# Patient Record
Sex: Female | Born: 1975 | Race: Black or African American | Marital: Single | Smoking: Former smoker
Health system: Southern US, Community
[De-identification: ages and names within clinical notes are randomized; demographics above are authoritative.]

## PROBLEM LIST (undated history)

## (undated) DIAGNOSIS — F329 Major depressive disorder, single episode, unspecified: Secondary | ICD-10-CM

## (undated) DIAGNOSIS — A6920 Lyme disease, unspecified: Secondary | ICD-10-CM

## (undated) DIAGNOSIS — K279 Peptic ulcer, site unspecified, unspecified as acute or chronic, without hemorrhage or perforation: Secondary | ICD-10-CM

## (undated) DIAGNOSIS — F32A Depression, unspecified: Secondary | ICD-10-CM

## (undated) DIAGNOSIS — F419 Anxiety disorder, unspecified: Secondary | ICD-10-CM

## (undated) DIAGNOSIS — G4730 Sleep apnea, unspecified: Secondary | ICD-10-CM

## (undated) HISTORY — DX: Peptic ulcer, site unspecified, unspecified as acute or chronic, without hemorrhage or perforation: K27.9

## (undated) HISTORY — PX: OTHER SURGICAL HISTORY: SHX169

---
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
## 2016-02-01 ENCOUNTER — Ambulatory Visit (HOSPITAL_COMMUNITY)
Admission: EM | Admit: 2016-02-01 | Discharge: 2016-02-01 | Disposition: A | Discharge status: Home / Self Care | Payer: Self-pay | Attending: Emergency Medicine | Admitting: Emergency Medicine

## 2016-02-01 ENCOUNTER — Encounter (HOSPITAL_COMMUNITY): Payer: Self-pay | Admitting: *Deleted

## 2016-02-01 ENCOUNTER — Ambulatory Visit (INDEPENDENT_AMBULATORY_CARE_PROVIDER_SITE_OTHER): Payer: Self-pay

## 2016-02-01 DIAGNOSIS — S161XXA Strain of muscle, fascia and tendon at neck level, initial encounter: Secondary | ICD-10-CM

## 2016-02-01 HISTORY — DX: Lyme disease, unspecified: A69.20

## 2016-02-01 MED ORDER — METHOCARBAMOL 500 MG PO TABS: 500.0000 mg | ORAL_TABLET | Freq: Two times a day (BID) | ORAL | 0 refills | Status: DC

## 2016-02-01 MED ORDER — IBUPROFEN 800 MG PO TABS: 800.0000 mg | ORAL_TABLET | Freq: Three times a day (TID) | ORAL | 0 refills | Status: DC
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
## 2016-04-14 ENCOUNTER — Other Ambulatory Visit: Payer: Self-pay | Admitting: Gastroenterology

## 2016-04-14 DIAGNOSIS — R1032 Left lower quadrant pain: Secondary | ICD-10-CM

## 2016-04-19 ENCOUNTER — Ambulatory Visit
Admission: RE | Admit: 2016-04-19 | Discharge: 2016-04-19 | Disposition: A | Discharge status: Home / Self Care | Payer: Self-pay | Source: Ambulatory Visit | Attending: Gastroenterology | Admitting: Gastroenterology

## 2016-04-19 DIAGNOSIS — R1032 Left lower quadrant pain: Secondary | ICD-10-CM

## 2016-04-19 MED ORDER — IOPAMIDOL (ISOVUE-300) INJECTION 61%: 100.0000 mL | Freq: Once | INTRAVENOUS | Status: DC | PRN

## 2016-05-05 ENCOUNTER — Other Ambulatory Visit: Payer: Self-pay | Admitting: Obstetrics and Gynecology

## 2016-05-05 ENCOUNTER — Other Ambulatory Visit (HOSPITAL_COMMUNITY)
Admission: RE | Admit: 2016-05-05 | Discharge: 2016-05-05 | Disposition: A | Discharge status: Home / Self Care | Payer: BLUE CROSS/BLUE SHIELD | Source: Ambulatory Visit | Attending: Obstetrics and Gynecology | Admitting: Obstetrics and Gynecology

## 2016-05-05 DIAGNOSIS — R87810 Cervical high risk human papillomavirus (HPV) DNA test positive: Secondary | ICD-10-CM | POA: Insufficient documentation

## 2016-05-05 DIAGNOSIS — Z1151 Encounter for screening for human papillomavirus (HPV): Secondary | ICD-10-CM | POA: Diagnosis not present

## 2016-05-05 DIAGNOSIS — Z01419 Encounter for gynecological examination (general) (routine) without abnormal findings: Secondary | ICD-10-CM | POA: Insufficient documentation

## 2016-05-06 LAB — CYTOLOGY - PAP
Diagnosis: NEGATIVE
HPV 16/18/45 genotyping: NEGATIVE
HPV: DETECTED — AB

## 2017-05-08 ENCOUNTER — Other Ambulatory Visit: Payer: Self-pay | Admitting: Obstetrics and Gynecology

## 2017-05-08 ENCOUNTER — Other Ambulatory Visit (HOSPITAL_COMMUNITY)
Admission: RE | Admit: 2017-05-08 | Discharge: 2017-05-08 | Disposition: A | Discharge status: Home / Self Care | Payer: BLUE CROSS/BLUE SHIELD | Source: Ambulatory Visit | Attending: Obstetrics and Gynecology | Admitting: Obstetrics and Gynecology

## 2017-05-08 DIAGNOSIS — Z01411 Encounter for gynecological examination (general) (routine) with abnormal findings: Secondary | ICD-10-CM | POA: Diagnosis not present

## 2017-05-10 LAB — CYTOLOGY - PAP
Chlamydia: NEGATIVE
Diagnosis: NEGATIVE
HPV 16/18/45 genotyping: NEGATIVE
HPV: DETECTED — AB
Neisseria Gonorrhea: NEGATIVE

## 2017-05-31 ENCOUNTER — Other Ambulatory Visit: Payer: Self-pay | Admitting: Obstetrics and Gynecology

## 2018-07-02 ENCOUNTER — Other Ambulatory Visit: Payer: Self-pay | Admitting: Obstetrics and Gynecology

## 2018-07-02 ENCOUNTER — Other Ambulatory Visit (HOSPITAL_COMMUNITY)
Admission: RE | Admit: 2018-07-02 | Discharge: 2018-07-02 | Disposition: A | Discharge status: Home / Self Care | Payer: BC Managed Care – PPO | Source: Ambulatory Visit | Attending: Obstetrics and Gynecology | Admitting: Obstetrics and Gynecology

## 2018-07-02 DIAGNOSIS — Z124 Encounter for screening for malignant neoplasm of cervix: Secondary | ICD-10-CM | POA: Insufficient documentation

## 2018-07-03 LAB — CYTOLOGY - PAP
Chlamydia: NEGATIVE
Diagnosis: NEGATIVE
HPV: NOT DETECTED
Neisseria Gonorrhea: NEGATIVE

## 2018-09-14 ENCOUNTER — Other Ambulatory Visit: Payer: Self-pay

## 2018-09-14 ENCOUNTER — Emergency Department (HOSPITAL_COMMUNITY): Payer: BC Managed Care – PPO

## 2018-09-14 ENCOUNTER — Encounter (HOSPITAL_COMMUNITY): Payer: Self-pay | Admitting: Emergency Medicine

## 2018-09-14 ENCOUNTER — Emergency Department (HOSPITAL_COMMUNITY)
Admission: EM | Admit: 2018-09-14 | Discharge: 2018-09-15 | Disposition: A | Discharge status: Home / Self Care | Payer: BC Managed Care – PPO | Attending: Emergency Medicine | Admitting: Emergency Medicine

## 2018-09-14 DIAGNOSIS — F419 Anxiety disorder, unspecified: Secondary | ICD-10-CM

## 2018-09-14 DIAGNOSIS — R0602 Shortness of breath: Secondary | ICD-10-CM | POA: Insufficient documentation

## 2018-09-14 DIAGNOSIS — R0789 Other chest pain: Secondary | ICD-10-CM | POA: Diagnosis present

## 2018-09-14 DIAGNOSIS — Z20828 Contact with and (suspected) exposure to other viral communicable diseases: Secondary | ICD-10-CM | POA: Insufficient documentation

## 2018-09-14 DIAGNOSIS — Z79899 Other long term (current) drug therapy: Secondary | ICD-10-CM | POA: Diagnosis not present

## 2018-09-14 DIAGNOSIS — Z87891 Personal history of nicotine dependence: Secondary | ICD-10-CM | POA: Insufficient documentation

## 2018-09-14 HISTORY — DX: Depression, unspecified: F32.A

## 2018-09-14 HISTORY — DX: Anxiety disorder, unspecified: F41.9

## 2018-09-14 HISTORY — DX: Sleep apnea, unspecified: G47.30

## 2018-09-14 LAB — BASIC METABOLIC PANEL
Anion gap: 12 (ref 5–15)
BUN: 8 mg/dL (ref 6–20)
CO2: 21 mmol/L — ABNORMAL LOW (ref 22–32)
Calcium: 9 mg/dL (ref 8.9–10.3)
Chloride: 102 mmol/L (ref 98–111)
Creatinine, Ser: 1.28 mg/dL — ABNORMAL HIGH (ref 0.44–1.00)
GFR calc Af Amer: >60 mL/min (ref >60)
GFR calc non Af Amer: 52 mL/min — ABNORMAL LOW (ref >60)
Glucose, Bld: 159 mg/dL — ABNORMAL HIGH (ref 70–99)
Potassium: 3 mmol/L — ABNORMAL LOW (ref 3.5–5.1)
Sodium: 135 mmol/L (ref 135–145)

## 2018-09-14 LAB — CBC
HCT: 39.3 % (ref 36.0–46.0)
Hemoglobin: 12.7 g/dL (ref 12.0–15.0)
MCH: 28.7 pg (ref 26.0–34.0)
MCHC: 32.3 g/dL (ref 30.0–36.0)
MCV: 88.9 fL (ref 80.0–100.0)
Platelets: 349 10*3/uL (ref 150–400)
RBC: 4.42 MIL/uL (ref 3.87–5.11)
RDW: 11.9 % (ref 11.5–15.5)
WBC: 7.3 10*3/uL (ref 4.0–10.5)
nRBC: 0 % (ref 0.0–0.2)

## 2018-09-14 LAB — D-DIMER, QUANTITATIVE: D-Dimer, Quant: <0.27 ug/mL-FEU (ref 0.00–0.50)

## 2018-09-14 LAB — TROPONIN I (HIGH SENSITIVITY)
Troponin I (High Sensitivity): <2 ng/L (ref <18)
Troponin I (High Sensitivity): <2 ng/L (ref <18)

## 2018-09-14 LAB — RAPID URINE DRUG SCREEN, HOSP PERFORMED
Amphetamines: NOT DETECTED
Barbiturates: NOT DETECTED
Benzodiazepines: NOT DETECTED
Cocaine: NOT DETECTED
Opiates: NOT DETECTED
Tetrahydrocannabinol: POSITIVE — AB

## 2018-09-14 LAB — POC URINE PREG, ED: Preg Test, Ur: NEGATIVE

## 2018-09-14 LAB — ETHANOL: Alcohol, Ethyl (B): <10 mg/dL (ref <10)

## 2018-09-14 MED ORDER — LORAZEPAM 2 MG/ML IJ SOLN: 1.0000 mg | Freq: Once | INTRAMUSCULAR | Status: DC

## 2018-09-14 MED ORDER — POTASSIUM CHLORIDE CRYS ER 20 MEQ PO TBCR
40.0000 meq | EXTENDED_RELEASE_TABLET | Freq: Once | ORAL | Status: AC
Start: 2018-09-14 — End: 2018-09-14
  Administered 2018-09-14: 40 meq via ORAL
  Filled 2018-09-14: qty 2

## 2018-09-14 MED ORDER — LORAZEPAM 1 MG PO TABS
1.0000 mg | ORAL_TABLET | Freq: Once | ORAL | Status: AC
  Administered 2018-09-14: 1 mg via ORAL
  Filled 2018-09-14: qty 1

## 2018-09-14 NOTE — ED Notes (Signed)
EKG completed while patient was talking on the phone, hyperventilating and not sitting still despite all instruction from nurse to do otherwise.

## 2018-09-14 NOTE — ED Notes (Signed)
Pt on phone with family. Family trying to relieve pt anxiety.

## 2018-09-14 NOTE — ED Provider Notes (Signed)
[facility] EMERGENCY DEPARTMENT Provider Note   CSN: [id number] Arrival date & time: 09/14/18  1958     History   Chief Complaint Chief Complaint  Patient presents with  . Chest Pain    HPI Amanda Hernandez is a 43 y.o. female.     The history is provided by the patient. No language interpreter was used.  Shortness of Breath Severity:  Severe Onset quality:  Gradual Duration:  1 day Timing:  Constant Progression:  Worsening Chronicity:  New Context: emotional upset   Relieved by:  Nothing Worsened by:  Nothing Ineffective treatments:  None tried Associated symptoms: no abdominal pain   Risk factors: no recent alcohol use   Pt reports she has been very anxious about getting covid.  Pt reports she was taking klonopin but her MD switched her to ativan.  Pt reports she has been taking.    Past Medical History:  Diagnosis Date  . Anxiety   . Depression   . Lyme disease   . Sleep apnea     There are no active problems to display for this patient.   History reviewed. No pertinent surgical history.   OB History   No obstetric history on file.      Home Medications    Prior to Admission medications   Medication Sig Start Date End Date Taking? Authorizing Provider  ibuprofen (ADVIL,MOTRIN) 800 MG tablet Take 1 tablet (800 mg total) by mouth 3 (three) times daily. Patient not taking: Reported on 09/14/2018 02/01/16   Elson AreasSofia, Leslie K, PA-C  methocarbamol (ROBAXIN) 500 MG tablet Take 1 tablet (500 mg total) by mouth 2 (two) times daily. Patient not taking: Reported on 09/14/2018 02/01/16   Osie CheeksSofia, Leslie K, PA-C    Family History No family history on file.  Social History Social History   Tobacco Use  . Smoking status: Former Games developermoker  . Smokeless tobacco: Never Used  Substance Use Topics  . Alcohol use: Yes    Comment: occ  . Drug use: Never     Allergies   Patient has no known allergies.   Review of Systems Review of Systems  Respiratory: Positive  for shortness of breath.   Gastrointestinal: Negative for abdominal pain.  All other systems reviewed and are negative.    Physical Exam Updated Vital Signs BP (!) 138/100   Pulse 96   Temp 98.1 F (36.7 C) (Oral)   Resp (!) 21   LMP 09/07/2018   SpO2 100%   Physical Exam Vitals signs and nursing note reviewed.  Constitutional:      Appearance: She is well-developed.  HENT:     Head: Normocephalic.  Neck:     Musculoskeletal: Normal range of motion.  Cardiovascular:     Rate and Rhythm: Normal rate.     Heart sounds: Normal heart sounds.  Pulmonary:     Effort: Pulmonary effort is normal.     Breath sounds: Normal breath sounds.  Abdominal:     General: Bowel sounds are normal. There is no distension.     Palpations: Abdomen is soft.  Musculoskeletal: Normal range of motion.  Skin:    General: Skin is warm.  Neurological:     General: No focal deficit present.     Mental Status: She is alert and oriented to person, place, and time.  Psychiatric:        Mood and Affect: Mood is anxious.     Comments: Pt hyperventilating.  Pt complains of tingling in extremities  ED Treatments / Results  Labs (all labs ordered are listed, but only abnormal results are displayed) Labs Reviewed  BASIC METABOLIC PANEL - Abnormal; Notable for the following components:      Result Value   Potassium 3.0 (*)    CO2 21 (*)    Glucose, Bld 159 (*)    Creatinine, Ser 1.28 (*)    GFR calc non Af Amer 52 (*)    All other components within normal limits  RAPID URINE DRUG SCREEN, HOSP PERFORMED - Abnormal; Notable for the following components:   Tetrahydrocannabinol POSITIVE (*)    All other components within normal limits  NOVEL CORONAVIRUS, NAA (HOSPITAL ORDER, SEND-OUT TO REF LAB)  CBC  ETHANOL  D-DIMER, QUANTITATIVE (NOT AT [facility])  POC URINE PREG, ED  TROPONIN I (HIGH SENSITIVITY)  TROPONIN I (HIGH SENSITIVITY)    EKG EKG Interpretation  Date/Time:  Friday September 14 2018 20:53:41 EDT Ventricular Rate:  85 PR Interval:    QRS Duration: 81 QT Interval:  370 QTC Calculation: 440 R Axis:   73 Text Interpretation:  Sinus rhythm Borderline prolonged PR interval Baseline wander No old tracing to compare Confirmed by Francine Graven [phone]) on 09/14/2018 9:00:25 PM   Radiology Dg Chest 2 View  Result Date: 09/14/2018 CLINICAL DATA:  Left-sided chest pain EXAM: CHEST - 2 VIEW COMPARISON:  None. FINDINGS: The heart size and mediastinal contours are within normal limits. Both lungs are clear. The visualized skeletal structures are unremarkable. IMPRESSION: No acute abnormality of the lungs. Electronically Signed   By: Eddie Candle M.D.   On: 09/14/2018 20:23    Procedures Procedures (including critical care time)  Medications Ordered in ED Medications  potassium chloride SA (K-DUR) CR tablet 40 mEq (has no administration in time range)  LORazepam (ATIVAN) tablet 1 mg (has no administration in time range)     Initial Impression / Assessment and Plan / ED Course  I have reviewed the triage vital signs and the nursing notes.  Pertinent labs & imaging results that were available during my care of the patient were reviewed by me and considered in my medical decision making (see chart for details).  Clinical Course as of Sep 13 2332  Fri Sep 14, 2018  2322 Troponin I (High Sensitivity): <2.0 [LS]    Clinical Course User Index [LS] Fransico Meadow, [place]      MDM  Ekg no acute abnormality, chest xray is normal, ddimer and troponin is normal.  Pt has potassium of 3.0.  Pt given kdur.  69meq.  Ativan 1 mg Iv.   Covid test is sent. Pt's symptoms seem to be second to anxiety.  Pt denies suicidal or omicidal thoughts.    Final Clinical Impressions(s) / ED Diagnoses   Final diagnoses:  Anxiety    ED Discharge Orders    None    An After Visit Summary was printed and given to the patient.    Fransico Meadow, PA-C 09/14/18 [place],  [place], DO 09/20/18 [phone]

## 2018-09-14 NOTE — Discharge Instructions (Addendum)
C all your Physician on Monday to discuss anxiety medications.

## 2018-09-14 NOTE — ED Triage Notes (Signed)
Pt here with L sided chest pain since this am. States it has gotten worse and feels like it is radiating down L arm. Pt with hx of anxiety and states she is having withdrawals from Klonopin. States she stopped it a month ago because she feels like it was making her anxiety worse.

## 2018-09-16 LAB — NOVEL CORONAVIRUS, NAA (HOSP ORDER, SEND-OUT TO REF LAB; TAT 18-24 HRS): SARS-CoV-2, NAA: NOT DETECTED

## 2018-09-17 ENCOUNTER — Encounter (HOSPITAL_COMMUNITY): Payer: Self-pay | Admitting: Emergency Medicine

## 2018-09-17 ENCOUNTER — Ambulatory Visit (HOSPITAL_COMMUNITY)
Admission: EM | Admit: 2018-09-17 | Discharge: 2018-09-17 | Disposition: A | Discharge status: Home / Self Care | Payer: BC Managed Care – PPO | Attending: Emergency Medicine | Admitting: Emergency Medicine

## 2018-09-17 ENCOUNTER — Other Ambulatory Visit: Payer: Self-pay

## 2018-09-17 DIAGNOSIS — F419 Anxiety disorder, unspecified: Secondary | ICD-10-CM | POA: Diagnosis present

## 2018-09-17 DIAGNOSIS — Z760 Encounter for issue of repeat prescription: Secondary | ICD-10-CM | POA: Diagnosis present

## 2018-09-17 DIAGNOSIS — R112 Nausea with vomiting, unspecified: Secondary | ICD-10-CM | POA: Diagnosis present

## 2018-09-17 MED ORDER — CLONAZEPAM 0.5 MG PO TABS: 0.5000 mg | ORAL_TABLET | Freq: Two times a day (BID) | ORAL | 0 refills | Status: DC | PRN

## 2018-09-17 MED ORDER — QUETIAPINE FUMARATE 50 MG PO TABS: 50.0000 mg | ORAL_TABLET | Freq: Two times a day (BID) | ORAL | 0 refills | Status: DC

## 2018-09-17 MED ORDER — ONDANSETRON 4 MG PO TBDP: 4.0000 mg | ORAL_TABLET | Freq: Three times a day (TID) | ORAL | 0 refills | Status: DC | PRN

## 2018-09-17 NOTE — Discharge Instructions (Addendum)
Discontinue the Vyvanse, lorazepam.  Decrease his Seroquel to 50 mg during the day and 100 mg at night.  Restart the Klonopin.  Zofran as needed for nausea and vomiting.  I would advise a bland diet for the next several days.  Follow-up with your psychiatrist within a week to notify them of the medication change and to come up with something that works for you.  Follow-up with primary care physician of your choice, see list below.  You may need another round of Holter monitoring, follow-up with cardiology for this.  Below is a list of primary care practices who are taking new patients for you to follow-up with.  [facility] at [facility] [address] [place], [place] [address] [phone]  [place], [place] [address] [phone]  [facility] [phone] [place] [address]   family Practice Center: [place]  [phone]  [place] [facility]: [place]   [phone]  [facility] Family Medicine: [address] [place]  [phone]  [facility] : [address] [place] [phone]  [facility] Primary Care: [address] [place] [id number] [phone]  [facility]: [place] [phone]  Dr. Blanchie Serve [place] [facility]  [phone]  Dr. Benito Mccreedy, [facility]. [place], [place] [address]  [phone]  Go to www.goodrx.com to look up your medications. This will give you a list of where you can find your prescriptions at the most affordable prices. Or ask  the pharmacist what the cash price is, or if they have any other discount programs available to help make your medication more affordable. This can be less expensive than what you would pay with insurance.

## 2018-09-17 NOTE — ED Triage Notes (Addendum)
Patient seen at [facility] 8/21  Off klonopin for a month, has been taking lorazepam.  But symptoms getting worse  Says psychiatrist -unable to make an appt and does not believe they know what they are talking about

## 2018-09-17 NOTE — ED Provider Notes (Signed)
HPI  SUBJECTIVE:  Amanda Hernandez is a 43 y.o. female who presents with episodes of tachycardia, "shakiness ", nausea and malaise since having her medications changed 2 months ago.  States that her heart rate is in the 160s during these episodes per her Apple Watch. states like she feels as if she is having a heart attack during these episodes.  She states that she was on 50 mg of Seroquel, but it was increased to 200.  She was on Klonopin as needed daily and was switched to lorazepam.  She was also started on Vyvanse.  She states that the lorazepam makes her nauseous and that the Vyvanse triggers off episodes of tachycardia.  She states that the low-dose Seroquel/Klonopin worked well for her for 2 years.  She was seen in the ER 3 days ago for chest pain, tachycardia, nausea.  Chest pain ER work-up including EKG, dimer, 2 troponins, were unremarkable.  She also reports nausea and vomiting starting 2 days ago.  No vomiting today.  She is tolerating p.o.  No change in urine output, abdominal pain, fevers, questionable leftovers, raw or undercooked foods.  She is having normal bowel movements now.  States that she tested negative for COVID 3 days ago in the ED.  She tried a leftover prescription of Valium for her symptoms.  Pushing fluids seem to make her feel better.  No aggravating factors.  Past medical history of chronic Lyme disease, anxiety/depression.  She has had a Holter monitor twice with nothing found.  No history of SVT, atrial fibrillation, hypothyroidism.  Family history significant for mother with hyperthyroidism, negative for Wolff-Parkinson-White disease, arrhythmia.  LMP: Last week.  PMD: None.  Psychiatrist: Dr. Ladona Ridgelaylor.    Past Medical History:  Diagnosis Date  . Anxiety   . Depression   . Lyme disease   . Sleep apnea     History reviewed. No pertinent surgical history.  Family History  Problem Relation Age of Onset  . Post-traumatic stress disorder Mother   . Diabetes Father      Social History   Tobacco Use  . Smoking status: Former Games developermoker  . Smokeless tobacco: Never Used  Substance Use Topics  . Alcohol use: Yes    Comment: occ  . Drug use: Never    No current facility-administered medications for this encounter.   Current Outpatient Medications:  .  Fe Fum-FePoly-Vit C-Vit B3 (INTEGRA PO), Take by mouth., Disp: , Rfl:  .  Lisdexamfetamine Dimesylate (VYVANSE PO), Take by mouth., Disp: , Rfl:  .  clonazePAM (KLONOPIN) 0.5 MG tablet, Take 1 tablet (0.5 mg total) by mouth 2 (two) times daily as needed for anxiety., Disp: 20 tablet, Rfl: 0 .  ondansetron (ZOFRAN ODT) 4 MG disintegrating tablet, Take 1 tablet (4 mg total) by mouth every 8 (eight) hours as needed for nausea or vomiting., Disp: 20 tablet, Rfl: 0 .  QUEtiapine (SEROQUEL) 50 MG tablet, Take 1 tablet (50 mg total) by mouth 2 (two) times daily. May take an additional 50 mg at bedtime, Disp: 40 tablet, Rfl: 0  Allergies  Allergen Reactions  . Tylenol With Codeine #3 [Acetaminophen-Codeine]      ROS  As noted in HPI.   Physical Exam  BP (!) 147/97 (BP Location: Right Arm)   Pulse 85   Temp 98.1 F (36.7 C) (Oral)   Resp 18   LMP 09/07/2018   SpO2 98%   Constitutional: Well developed, well nourished, no acute distress Eyes:  EOMI, conjunctiva normal bilaterally  HENT: Normocephalic, atraumatic,mucus membranes moist Neck: No tender ness of the thyroid, no thyromegaly Respiratory: Normal inspiratory effort Cardiovascular: Normal rate regular rhythm no murmurs rubs or gallops GI: nondistended soft, nontender, active bowel sounds, no guarding, rebound. skin: No rash, skin intact Musculoskeletal: no deformities Neurologic: Alert & oriented x 3, no focal neuro deficits Psychiatric: Speech and behavior appropriate   ED Course   Medications - No data to display  No orders of the defined types were placed in this encounter.   No results found for this or any previous visit (from  the past 24 hour(s)). No results found.  ED Clinical Impression  1. Anxiety   2. Medication refill   3. Non-intractable vomiting with nausea, unspecified vomiting type      ED Assessment/Plan  [place] Narcotic database reviewed for this patient, and feel that the risk/benefit ratio today is favorable for proceeding with a prescription for controlled substance.  Patient has been getting Klonopin, and then the vyvanse and lorazepam from the same practice for the past 2 years.  Suspect that patient is having some uncontrolled anxiety or may be due to a medication side effect since symptoms started after the medication changes.  Also in the differential is SVT, hyperthyroidism.  She has a benign abdomen and appears nontoxic today.  Unsure as to the cause of nausea and vomiting but does not appear to be an emergency today.  We will treat this with Zofran.  Will discontinue her Vyvanse as this is probably contributing to her tachycardia, restart her previous medication regimen by decreasing her Seroquel to 50 mg during the day, 100 mg at night, and restart her on Klonopin 0.5 twice daily as needed #20 since this combination of medication worked well for her for 2 years without any side effects.  Advised her to get in touch with her psychiatrist to notify them of the medication change and to work with them regarding a medication regimen that will work for her.  We will also provide a primary care list for routine care and a referral to [facility] cardiology should her symptoms persist.  In the differential is hyperthyroidism, but feel this is less likely given that the symptoms started after a medication change.  Discussed MDM, treatment plan, and plan for follow-up with patient. Discussed sn/sx that should prompt return to the ED. patient agrees with plan.   Meds ordered this encounter  Medications  . QUEtiapine (SEROQUEL) 50 MG tablet    Sig: Take 1 tablet (50 mg total) by mouth 2 (two) times daily. May take  an additional 50 mg at bedtime    Dispense:  40 tablet    Refill:  0  . clonazePAM (KLONOPIN) 0.5 MG tablet    Sig: Take 1 tablet (0.5 mg total) by mouth 2 (two) times daily as needed for anxiety.    Dispense:  20 tablet    Refill:  0  . ondansetron (ZOFRAN ODT) 4 MG disintegrating tablet    Sig: Take 1 tablet (4 mg total) by mouth every 8 (eight) hours as needed for nausea or vomiting.    Dispense:  20 tablet    Refill:  0    *This clinic note was created using [employer]. Therefore, there may be occasional mistakes despite careful proofreading.   ?   Melynda Ripple, MD 09/18/18 1218

## 2018-09-21 NOTE — Progress Notes (Signed)
Amanda Abideeferring-Ashley Mortenson MD Reason for referral-Palpitations  HPI: 43 yo female for evaluation of palpitations at request of Domenick Gongshley Mortenson MD. Labs recently 09/14/18 showed normal troponin, positive tetrahydrocannabinol, K 3, Cr 1.28, Hgb 12.7, negative DDimer; chest xray negative.  Patient seen with palpitations in the emergency room August 2020 and cardiology asked to evaluate.  Patient has had intermittent palpitations for approximately 2 years.  This can be described as a skip but also more sustained palpitations.  She has had some dizziness associated with the symptoms.  She also describes dyspnea at times both with exertion and at rest.  No orthopnea, PND or pedal edema.  She also has occasional chest pain lasting 30 minutes to 2 hours.  Not pleuritic, positional or exertional.  Current Outpatient Medications  Medication Sig Dispense Refill  . B COMPLEX VITAMINS PO Take 1 tablet by mouth daily.    . diazepam (VALIUM) 5 MG tablet Take 2.5 mg by mouth as needed for anxiety.    . Fe Fum-FePoly-Vit C-Vit B3 (INTEGRA PO) Take by mouth.    . fexofenadine (ALLEGRA) 180 MG tablet Take 180 mg by mouth daily.    . Lisdexamfetamine Dimesylate (VYVANSE PO) Take by mouth.    . ondansetron (ZOFRAN ODT) 4 MG disintegrating tablet Take 1 tablet (4 mg total) by mouth every 8 (eight) hours as needed for nausea or vomiting. 20 tablet 0  . Potassium 99 MG TABS Take 1 tablet by mouth daily.    . QUEtiapine (SEROQUEL) 50 MG tablet Take 1 tablet (50 mg total) by mouth 2 (two) times daily. May take an additional 50 mg at bedtime 40 tablet 0   No current facility-administered medications for this visit.     Allergies  Allergen Reactions  . Tylenol With Codeine #3 [Acetaminophen-Codeine]      Past Medical History:  Diagnosis Date  . Anxiety   . Depression   . Lyme disease   . PUD (peptic ulcer disease)   . Sleep apnea     Past Surgical History:  Procedure Laterality Date  . No prior surgery       Social History   Socioeconomic History  . Marital status: Single    Spouse name: Not on file  . Number of children: Not on file  . Years of education: Not on file  . Highest education level: Not on file  Occupational History  . Not on file  Social Needs  . Financial resource strain: Not on file  . Food insecurity    Worry: Not on file    Inability: Not on file  . Transportation needs    Medical: Not on file    Non-medical: Not on file  Tobacco Use  . Smoking status: Former Games developermoker  . Smokeless tobacco: Never Used  Substance and Sexual Activity  . Alcohol use: Yes    Comment: Rarely  . Drug use: Never  . Sexual activity: Never  Lifestyle  . Physical activity    Days per week: Not on file    Minutes per session: Not on file  . Stress: Not on file  Relationships  . Social Musicianconnections    Talks on phone: Not on file    Gets together: Not on file    Attends religious service: Not on file    Active member of club or organization: Not on file    Attends meetings of clubs or organizations: Not on file    Relationship status: Not on file  . Intimate  partner violence    Fear of current or ex partner: Not on file    Emotionally abused: Not on file    Physically abused: Not on file    Forced sexual activity: Not on file  Other Topics Concern  . Not on file  Social History Narrative  . Not on file    Family History  Problem Relation Age of Onset  . Post-traumatic stress disorder Mother   . Diabetes Father   . Hypertension Father     ROS: Describes decreased memory but no fevers or chills, productive cough, hemoptysis, dysphasia, odynophagia, melena, hematochezia, dysuria, hematuria, rash, seizure activity, orthopnea, PND, pedal edema, claudication. Remaining systems are negative.  Physical Exam:   Blood pressure 116/84, pulse 88, temperature 98.1 F (36.7 C), height 6' 1.5" (1.867 m), weight 201 lb (91.2 kg), last menstrual period 09/07/2018.  General:  Well  developed/well nourished in NAD Skin warm/dry; tattoos Patient not depressed No peripheral clubbing Back-normal HEENT-normal/normal eyelids Neck supple/normal carotid upstroke bilaterally; no bruits; no JVD; no thyromegaly chest - CTA/ normal expansion CV - RRR/normal S1 and S2; no murmurs, rubs or gallops;  PMI nondisplaced Abdomen -NT/ND, no HSM, no mass, + bowel sounds, no bruit 2+ femoral pulses, no bruits Ext-no edema, chords, 2+ DP Neuro-grossly nonfocal  ECG - 09/14/18; Sinus with no ST changes; personally reviewed  A/P  1 palpitations-etiology unclear.  Patient has an apple watch and she will forward strips to [place] to correlate with her symptoms.  Schedule echocardiogram to assess LV function.  Check TSH.  2 anxiety-Per primary care.  3 Chest pain-atypical.  Recent enzymes negative as well as d-dimer.  Electrocardiogram showed no ST changes.  Echocardiogram to assess wall motion and LV function.  4 dyspnea-schedule echocardiogram to assess LV function.  Recent d-dimer negative.  Kirk Ruths, MD

## 2018-09-25 ENCOUNTER — Encounter: Payer: Self-pay | Admitting: Cardiology

## 2018-09-25 ENCOUNTER — Ambulatory Visit: Payer: BC Managed Care – PPO | Admitting: Cardiology

## 2018-09-25 ENCOUNTER — Other Ambulatory Visit: Payer: Self-pay

## 2018-09-25 VITALS — BP 116/84 | HR 88 | Temp 98.1°F | Ht 73.5 in | Wt 201.0 lb

## 2018-09-25 DIAGNOSIS — R072 Precordial pain: Secondary | ICD-10-CM

## 2018-09-25 DIAGNOSIS — R002 Palpitations: Secondary | ICD-10-CM | POA: Diagnosis not present

## 2018-09-25 DIAGNOSIS — R0602 Shortness of breath: Secondary | ICD-10-CM

## 2018-09-25 LAB — TSH: TSH: 1.79 u[IU]/mL (ref 0.450–4.500)

## 2018-09-25 NOTE — Patient Instructions (Signed)
Medication Instructions:  NO CHANGE If you need a refill on your cardiac medications before your next appointment, please call your pharmacy.   Lab work: Your physician recommends that you HAVE LAB WORK TODAY If you have labs (blood work) drawn today and your tests are completely normal, you will receive your results only by: Marland Kitchen MyChart Message (if you have MyChart) OR . A paper copy in the mail If you have any lab test that is abnormal or we need to change your treatment, we will call you to review the results.  Testing/Procedures: Your physician has requested that you have an echocardiogram. Echocardiography is a painless test that uses sound waves to create images of your heart. It provides your doctor with information about the size and shape of your heart and how well your heart's chambers and valves are working. This procedure takes approximately one hour. There are no restrictions for this procedure.[place]    Follow-Up: At [facility], you and your health needs are our priority.  As part of our continuing mission to provide you with exceptional heart care, we have created designated Provider Care Teams.  These Care Teams include your primary Cardiologist (physician) and Advanced Practice Providers (APPs -  Physician Assistants and Nurse Practitioners) who all work together to provide you with the care you need, when you need it. Your physician recommends that you schedule a follow-up appointment in: 3-4 MONTHS WITH DR Stanford Breed

## 2018-10-03 ENCOUNTER — Other Ambulatory Visit: Payer: Self-pay

## 2018-10-03 ENCOUNTER — Ambulatory Visit (HOSPITAL_COMMUNITY): Payer: BC Managed Care – PPO | Attending: Cardiology

## 2018-10-03 ENCOUNTER — Telehealth: Payer: Self-pay

## 2018-10-03 DIAGNOSIS — R002 Palpitations: Secondary | ICD-10-CM | POA: Diagnosis present

## 2018-10-03 NOTE — Telephone Encounter (Signed)
Pt wants to set up a new pt appointment with Dr. Loletha Grayer.

## 2018-10-03 NOTE — Telephone Encounter (Signed)
Called to set up but had to leave message for pt to call back

## 2018-10-11 ENCOUNTER — Ambulatory Visit: Payer: BC Managed Care – PPO | Admitting: Family Medicine

## 2018-10-17 ENCOUNTER — Ambulatory Visit: Payer: BC Managed Care – PPO | Admitting: Family Medicine

## 2018-10-19 ENCOUNTER — Encounter: Payer: Self-pay | Admitting: Family Medicine

## 2018-10-19 ENCOUNTER — Other Ambulatory Visit: Payer: Self-pay

## 2018-10-19 ENCOUNTER — Ambulatory Visit (INDEPENDENT_AMBULATORY_CARE_PROVIDER_SITE_OTHER): Payer: BC Managed Care – PPO | Admitting: Family Medicine

## 2018-10-19 VITALS — BP 110/90 | HR 93 | Temp 98.7°F | Ht 73.5 in | Wt 194.6 lb

## 2018-10-19 DIAGNOSIS — Z0001 Encounter for general adult medical examination with abnormal findings: Secondary | ICD-10-CM | POA: Diagnosis not present

## 2018-10-19 DIAGNOSIS — Z1239 Encounter for other screening for malignant neoplasm of breast: Secondary | ICD-10-CM

## 2018-10-19 DIAGNOSIS — R252 Cramp and spasm: Secondary | ICD-10-CM

## 2018-10-19 DIAGNOSIS — Z8659 Personal history of other mental and behavioral disorders: Secondary | ICD-10-CM

## 2018-10-19 DIAGNOSIS — G4700 Insomnia, unspecified: Secondary | ICD-10-CM

## 2018-10-19 DIAGNOSIS — F909 Attention-deficit hyperactivity disorder, unspecified type: Secondary | ICD-10-CM

## 2018-10-19 DIAGNOSIS — Z23 Encounter for immunization: Secondary | ICD-10-CM | POA: Diagnosis not present

## 2018-10-19 DIAGNOSIS — F419 Anxiety disorder, unspecified: Secondary | ICD-10-CM

## 2018-10-19 DIAGNOSIS — Z0000 Encounter for general adult medical examination without abnormal findings: Secondary | ICD-10-CM

## 2018-10-19 LAB — BASIC METABOLIC PANEL
BUN: 9 mg/dL (ref 6–23)
CO2: 23 mEq/L (ref 19–32)
Calcium: 9.2 mg/dL (ref 8.4–10.5)
Chloride: 104 mEq/L (ref 96–112)
Creatinine, Ser: 0.86 mg/dL (ref 0.40–1.20)
GFR: 87.67 mL/min (ref >60.00)
Glucose, Bld: 90 mg/dL (ref 70–99)
Potassium: 4.1 mEq/L (ref 3.5–5.1)
Sodium: 137 mEq/L (ref 135–145)

## 2018-10-19 LAB — LIPID PANEL
Cholesterol: 231 mg/dL — ABNORMAL HIGH (ref 0–200)
HDL: 57.5 mg/dL (ref >39.00)
LDL Cholesterol: 156 mg/dL — ABNORMAL HIGH (ref 0–99)
NonHDL: 173.34
Total CHOL/HDL Ratio: 4
Triglycerides: 88 mg/dL (ref 0.0–149.0)
VLDL: 17.6 mg/dL (ref 0.0–40.0)

## 2018-10-19 LAB — MAGNESIUM: Magnesium: 1.9 mg/dL (ref 1.5–2.5)

## 2018-10-19 LAB — VITAMIN D 25 HYDROXY (VIT D DEFICIENCY, FRACTURES): VITD: 27.46 ng/mL — ABNORMAL LOW (ref 30.00–100.00)

## 2018-10-19 LAB — AST: AST: 14 U/L (ref 0–37)

## 2018-10-19 LAB — ALT: ALT: 11 U/L (ref 0–35)

## 2018-10-19 MED ORDER — TRAZODONE HCL 50 MG PO TABS: ORAL_TABLET | ORAL | 0 refills | Status: DC

## 2018-10-19 NOTE — Patient Instructions (Addendum)
[facility]  [address], [place], [place] [address] [phone]  Health Maintenance, Female Adopting a healthy lifestyle and getting preventive care are important in promoting health and wellness. Ask your health care provider about:  The right schedule for you to have regular tests and exams.  Things you can do on your own to prevent diseases and keep yourself healthy. What should I know about diet, weight, and exercise? Eat a healthy diet   Eat a diet that includes plenty of vegetables, fruits, low-fat dairy products, and lean protein.  Do not eat a lot of foods that are high in solid fats, added sugars, or sodium. Maintain a healthy weight Body mass index (BMI) is used to identify weight problems. It estimates body fat based on height and weight. Your health care provider can help determine your BMI and help you achieve or maintain a healthy weight. Get regular exercise Get regular exercise. This is one of the most important things you can do for your health. Most adults should:  Exercise for at least 150 minutes each week. The exercise should increase your heart rate and make you sweat (moderate-intensity exercise).  Do strengthening exercises at least twice a week. This is in addition to the moderate-intensity exercise.  Spend less time sitting. Even light physical activity can be beneficial. Watch cholesterol and blood lipids Have your blood tested for lipids and cholesterol at 42 years of age, then have this test every 5 years. Have your cholesterol levels checked more often if:  Your lipid or cholesterol levels are high.  You are older than 42 years of age.  You are at high risk for heart disease. What should I know about cancer screening? Depending on your health history and family history, you may need to have cancer screening at various ages. This may include screening for:  Breast cancer.  Cervical cancer.  Colorectal cancer.   Skin cancer.  Lung cancer. What should I know about heart disease, diabetes, and high blood pressure? Blood pressure and heart disease  High blood pressure causes heart disease and increases the risk of stroke. This is more likely to develop in people who have high blood pressure readings, are of African descent, or are overweight.  Have your blood pressure checked: ? Every 3-5 years if you are 82-74 years of age. ? Every year if you are 5 years old or older. Diabetes Have regular diabetes screenings. This checks your fasting blood sugar level. Have the screening done:  Once every three years after age 14 if you are at a normal weight and have a low risk for diabetes.  More often and at a younger age if you are overweight or have a high risk for diabetes. What should I know about preventing infection? Hepatitis B If you have a higher risk for hepatitis B, you should be screened for this virus. Talk with your health care provider to find out if you are at risk for hepatitis B infection. Hepatitis C Testing is recommended for:  Everyone born from [birth year] through 1964.  Anyone with known risk factors for hepatitis C. Sexually transmitted infections (STIs)  Get screened for STIs, including gonorrhea and chlamydia, if: ? You are sexually active and are younger than 42 years of age. ? You are older than 42 years of age and your health care provider tells you that you are at risk for this type of infection. ? Your sexual activity has changed since you were last screened, and  you are at increased risk for chlamydia or gonorrhea. Ask your health care provider if you are at risk.  Ask your health care provider about whether you are at high risk for HIV. Your health care provider may recommend a prescription medicine to help prevent HIV infection. If you choose to take medicine to prevent HIV, you should first get tested for HIV. You should then be tested every 3 months for as long as you are  taking the medicine. Pregnancy  If you are about to stop having your period (premenopausal) and you may become pregnant, seek counseling before you get pregnant.  Take 400 to 800 micrograms (mcg) of folic acid every day if you become pregnant.  Ask for birth control (contraception) if you want to prevent pregnancy. Osteoporosis and menopause Osteoporosis is a disease in which the bones lose minerals and strength with aging. This can result in bone fractures. If you are 49 years old or older, or if you are at risk for osteoporosis and fractures, ask your health care provider if you should:  Be screened for bone loss.  Take a calcium or vitamin D supplement to lower your risk of fractures.  Be given hormone replacement therapy (HRT) to treat symptoms of menopause. Follow these instructions at home: Lifestyle  Do not use any products that contain nicotine or tobacco, such as cigarettes, e-cigarettes, and chewing tobacco. If you need help quitting, ask your health care provider.  Do not use street drugs.  Do not share needles.  Ask your health care provider for help if you need support or information about quitting drugs. Alcohol use  Do not drink alcohol if: ? Your health care provider tells you not to drink. ? You are pregnant, may be pregnant, or are planning to become pregnant.  If you drink alcohol: ? Limit how much you use to 0-1 drink a day. ? Limit intake if you are breastfeeding.  Be aware of how much alcohol is in your drink. In the [place] one drink equals one 12 oz bottle of beer (355 mL), one 5 oz glass of wine (148 mL), or one 1 oz glass of hard liquor (44 mL). General instructions  Schedule regular health, dental, and eye exams.  Stay current with your vaccines.  Tell your health care provider if: ? You often feel depressed. ? You have ever been abused or do not feel safe at home. Summary  Adopting a healthy lifestyle and getting preventive care are important  in promoting health and wellness.  Follow your health care provider's instructions about healthy diet, exercising, and getting tested or screened for diseases.  Follow your health care provider's instructions on monitoring your cholesterol and blood pressure. This information is not intended to replace advice given to you by your health care provider. Make sure you discuss any questions you have with your health care provider. Document Released: 07/26/2010 Document Revised: 01/03/2018 Document Reviewed: 01/03/2018 Elsevier Patient Education  2020 [employer].

## 2018-10-19 NOTE — Progress Notes (Signed)
Amanda Hernandez is a 43 y.o. female  Chief Complaint  Patient presents with  . Establish Care    est care/ cpe- fasting/ concerns about anxiety/ wants flu and tdap     HPI: Amanda Hernandez is a 43 y.o. female here to establish care with our office and for CPE, fasting labs. Pt would like flu vaccine and Tdap today.  Last PAP: few months ago - normal (h/o HPV) - [facility] Last mammo: few months ago - [facility]  Pt had been following with psychiatry for "a few years" - [facility].  Pt states she was not able to tolerate lorazepam - gave her a "panic attack". She has been taking valium and klonopin PRN. She states klonopin PRN makes her "brain forget everything". She had been on Seroquel nightly and was also on vyvanse but has been off for 2 months. She has therefore not been sleeping and notes increased anxiety.   She sees Darlyn Read (psych NP) for EMDR for PTSD x 4 mo.   Past Medical History:  Diagnosis Date  . Anxiety   . Depression   . Lyme disease   . PUD (peptic ulcer disease)   . Sleep apnea     Past Surgical History:  Procedure Laterality Date  . No prior surgery      Social History   Socioeconomic History  . Marital status: Single    Spouse name: Not on file  . Number of children: Not on file  . Years of education: Not on file  . Highest education level: Not on file  Occupational History  . Not on file  Social Needs  . Financial resource strain: Not on file  . Food insecurity    Worry: Not on file    Inability: Not on file  . Transportation needs    Medical: Not on file    Non-medical: Not on file  Tobacco Use  . Smoking status: Former Games developer  . Smokeless tobacco: Never Used  Substance and Sexual Activity  . Alcohol use: Yes    Comment: Rarely  . Drug use: Never  . Sexual activity: Never  Lifestyle  . Physical activity    Days per week: Not on file    Minutes per session: Not on file  . Stress: Not on file  Relationships  .  Social Musician on phone: Not on file    Gets together: Not on file    Attends religious service: Not on file    Active member of club or organization: Not on file    Attends meetings of clubs or organizations: Not on file    Relationship status: Not on file  . Intimate partner violence    Fear of current or ex partner: Not on file    Emotionally abused: Not on file    Physically abused: Not on file    Forced sexual activity: Not on file  Other Topics Concern  . Not on file  Social History Narrative  . Not on file    Family History  Problem Relation Age of Onset  . Post-traumatic stress disorder Mother   . Diabetes Father   . Hypertension Father      There is no immunization history for the selected administration types on file for this patient.  Outpatient Encounter Medications as of 10/19/2018  Medication Sig  . Fe Fum-FePoly-Vit C-Vit B3 (INTEGRA PO) Take by mouth.  . fexofenadine (ALLEGRA) 180 MG tablet Take 180 mg  by mouth daily.  . ondansetron (ZOFRAN ODT) 4 MG disintegrating tablet Take 1 tablet (4 mg total) by mouth every 8 (eight) hours as needed for nausea or vomiting.  . Potassium 99 MG TABS Take 1 tablet by mouth daily.  . diazepam (VALIUM) 5 MG tablet Take 2.5 mg by mouth as needed for anxiety.  . [DISCONTINUED] B COMPLEX VITAMINS PO Take 1 tablet by mouth daily.  . [DISCONTINUED] Lisdexamfetamine Dimesylate (VYVANSE PO) Take by mouth.  . [DISCONTINUED] QUEtiapine (SEROQUEL) 50 MG tablet Take 1 tablet (50 mg total) by mouth 2 (two) times daily. May take an additional 50 mg at bedtime   No facility-administered encounter medications on file as of 10/19/2018.      ROS: Gen: no fever, chills  Skin: no rash, itching ENT: no ear pain, ear drainage, nasal congestion, rhinorrhea, sinus pressure, sore throat Eyes: no blurry vision, double vision Resp: no cough, wheeze,SOB Breast: no breast tenderness, no nipple discharge, no breast masses CV: no CP,  palpitations, LE edema,  GI: no heartburn, n/v/d/c, abd pain GU: no dysuria, urgency, frequency, hematuria MSK: no joint pain, myalgias, back pain Neuro: no dizziness, headache, weakness, vertigo Psych: as above in HPI   Allergies  Allergen Reactions  . Sertraline Anxiety, Hypertension, Nausea And Vomiting, Other (See Comments), Palpitations, Photosensitivity, Shortness Of Breath and Tinitus  . Tylenol With Codeine #3 [Acetaminophen-Codeine]     BP 110/90   Pulse 93   Temp 98.7 F (37.1 C) (Oral)   Ht 6' 1.5" (1.867 m)   Wt 194 lb 9.6 oz (88.3 kg)   SpO2 100%   BMI 25.33 kg/m   Physical Exam   A/P:  1. Annual physical exam - due for dental and vision exams - discussed importance of regular CV exercise, healthy diet, adequate sleep - tdap and flu vaccines today - UTD on PAP/mammo - [facility] - will get records - ALT - AST - Basic metabolic panel - Lipid panel - VITAMIN D 25 Hydroxy (Vit-D Deficiency, Fractures) - next CPE in 1 year  2. Need for influenza vaccination - Flu Vaccine QUAD 6+ mos PF IM (Fluarix Quad PF)  3. Need for Tdap vaccination - Tdap vaccine greater than or equal to 7yo IM  4. Leg cramping - Basic metabolic panel - Magnesium  5. Attention deficit hyperactivity disorder (ADHD), unspecified ADHD type - Ambulatory referral to Psychiatry  6. Anxiety - Ambulatory referral to Psychiatry  7. Hx of borderline personality disorder - Ambulatory referral to Psychiatry  8. Insomnia, unspecified type - Ambulatory referral to Psychiatry Rx: - traZODone (DESYREL) 50 MG tablet; 1-2 tab po qHS PRN  Dispense: 60 tablet; Refill: 0

## 2018-10-20 ENCOUNTER — Encounter: Payer: Self-pay | Admitting: Family Medicine

## 2018-12-10 ENCOUNTER — Encounter: Payer: Self-pay | Admitting: *Deleted

## 2018-12-13 ENCOUNTER — Telehealth: Payer: Self-pay

## 2018-12-13 NOTE — Telephone Encounter (Signed)

## 2018-12-18 NOTE — Progress Notes (Signed)
Virtual Visit via Video Note   This visit type was conducted due to national recommendations for restrictions regarding the COVID-19 Pandemic (e.g. social distancing) in an effort to limit this patient's exposure and mitigate transmission in our community.  Due to his co-morbid illnesses, this patient is at least at moderate risk for complications without adequate follow up.  This format is felt to be most appropriate for this patient at this time.  All issues noted in this document were discussed and addressed.  A limited physical exam was performed with this format.  Please refer to the patient's chart for his consent to telehealth for [facility].    HPI: FU palpitations. Labs 09/14/18 showed normal troponin, positive THC, K 3, Cr 1.28, Hgb 12.7, negative DDimer; chest xray negative.  Patient seen with palpitations in the emergency room August 2020 and cardiology asked to evaluate.  Echo September 2020 showed normal LV function and mildly dilated aortic root measuring 38 mm.  Since last seen her palpitations have improved.  She denies dyspnea.  She does not have exertional chest pain or syncope.  Current Outpatient Medications  Medication Sig Dispense Refill  . diazepam (VALIUM) 5 MG tablet Take 2.5 mg by mouth as needed for anxiety.    . Fe Fum-FePoly-Vit C-Vit B3 (INTEGRA PO) Take by mouth.    . fexofenadine (ALLEGRA) 180 MG tablet Take 180 mg by mouth daily.    . ondansetron (ZOFRAN ODT) 4 MG disintegrating tablet Take 1 tablet (4 mg total) by mouth every 8 (eight) hours as needed for nausea or vomiting. 20 tablet 0  . Potassium 99 MG TABS Take 1 tablet by mouth daily.     No current facility-administered medications for this visit.      Past Medical History:  Diagnosis Date  . Anxiety   . Depression   . Lyme disease   . PUD (peptic ulcer disease)   . Sleep apnea     Past Surgical History:  Procedure Laterality Date  . No prior surgery      Social History    Socioeconomic History  . Marital status: Single    Spouse name: Not on file  . Number of children: Not on file  . Years of education: Not on file  . Highest education level: Not on file  Occupational History  . Not on file  Social Needs  . Financial resource strain: Not on file  . Food insecurity    Worry: Not on file    Inability: Not on file  . Transportation needs    Medical: Not on file    Non-medical: Not on file  Tobacco Use  . Smoking status: Former Research scientist (life sciences)  . Smokeless tobacco: Never Used  Substance and Sexual Activity  . Alcohol use: Yes    Comment: Rarely  . Drug use: Never  . Sexual activity: Never  Lifestyle  . Physical activity    Days per week: Not on file    Minutes per session: Not on file  . Stress: Not on file  Relationships  . Social Herbalist on phone: Not on file    Gets together: Not on file    Attends religious service: Not on file    Active member of club or organization: Not on file    Attends meetings of clubs or organizations: Not on file    Relationship status: Not on file  . Intimate partner violence    Fear of current or ex partner:  Not on file    Emotionally abused: Not on file    Physically abused: Not on file    Forced sexual activity: Not on file  Other Topics Concern  . Not on file  Social History Narrative  . Not on file    Family History  Problem Relation Age of Onset  . Post-traumatic stress disorder Mother   . Diabetes Father   . Hypertension Father     ROS: no fevers or chills, productive cough, hemoptysis, dysphasia, odynophagia, melena, hematochezia, dysuria, hematuria, rash, seizure activity, orthopnea, PND, pedal edema, claudication. Remaining systems are negative.  Physical Exam: Answers questions appropriately Normal affect No acute distress Remainder physical examination not performed (telehealth visit, coronavirus pandemic)   A/P  1 palpitations-symptoms have improved.  Previous echocardiogram  showed normal LV function.  TSH normal.  Patient has an apple watch.  2 chest pain-she has had atypical chest pain previously.  Echocardiogram showed normal LV function.  No plans for further evaluation at this point.  3 dyspnea-symptoms improved.  LV function normal on echocardiogram.  Previous D-dimer negative.  4 anxiety-Per primary care.  5 dilated aortic root-we will repeat an echocardiogram in 1 year to reassess.  COVID-19 Education: The importance of social distancing was discussed today.  Time:   Today, I have spent 17 minutes with the patient with telehealth technology discussing the above problems.     Medication Adjustments/Labs and Tests Ordered: Current medicines are reviewed at length with the patient today.  Concerns regarding medicines are outlined above.   Tests Ordered: No orders of the defined types were placed in this encounter.   Medication Changes: No orders of the defined types were placed in this encounter.   Follow Up:  Either In Person or Virtual in 1 year(s)  Signed, Olga Millers, MD  12/18/2018 3:57 PM    [facility]

## 2018-12-26 ENCOUNTER — Other Ambulatory Visit: Payer: Self-pay

## 2018-12-26 ENCOUNTER — Ambulatory Visit
Admission: RE | Admit: 2018-12-26 | Discharge: 2018-12-26 | Disposition: A | Discharge status: Home / Self Care | Payer: BC Managed Care – PPO | Source: Ambulatory Visit | Attending: Family Medicine | Admitting: Family Medicine

## 2018-12-26 DIAGNOSIS — Z1239 Encounter for other screening for malignant neoplasm of breast: Secondary | ICD-10-CM

## 2018-12-28 ENCOUNTER — Encounter: Payer: Self-pay | Admitting: Cardiology

## 2018-12-28 ENCOUNTER — Telehealth (INDEPENDENT_AMBULATORY_CARE_PROVIDER_SITE_OTHER): Payer: BC Managed Care – PPO | Admitting: Cardiology

## 2018-12-28 VITALS — HR 82 | Ht 73.5 in | Wt 190.0 lb

## 2018-12-28 DIAGNOSIS — R002 Palpitations: Secondary | ICD-10-CM | POA: Diagnosis not present

## 2018-12-28 DIAGNOSIS — R0602 Shortness of breath: Secondary | ICD-10-CM | POA: Diagnosis not present

## 2018-12-28 DIAGNOSIS — R072 Precordial pain: Secondary | ICD-10-CM

## 2018-12-28 NOTE — Patient Instructions (Signed)

## 2019-02-20 ENCOUNTER — Telehealth (INDEPENDENT_AMBULATORY_CARE_PROVIDER_SITE_OTHER): Payer: BC Managed Care – PPO | Admitting: Family Medicine

## 2019-02-20 ENCOUNTER — Encounter: Payer: Self-pay | Admitting: Family Medicine

## 2019-02-20 VITALS — Temp 97.5°F | Ht 73.0 in | Wt 210.0 lb

## 2019-02-20 DIAGNOSIS — J019 Acute sinusitis, unspecified: Secondary | ICD-10-CM | POA: Diagnosis not present

## 2019-02-20 MED ORDER — METHYLPREDNISOLONE 4 MG PO TBPK: ORAL_TABLET | ORAL | 0 refills | Status: DC

## 2019-02-20 NOTE — Patient Instructions (Signed)
-   stop regular use of Afrin - use nasal saline spray 3x/day - add flonase daily - increase water intake - take medrol dose pack as instructed

## 2019-02-20 NOTE — Progress Notes (Signed)
Virtual Visit via Video Note  I connected with Amanda Hernandez on 02/20/19 at  2:30 PM EST by a video enabled telemedicine application and verified that I am speaking with the correct person using two identifiers. Location patient: home Location provider: home office Persons participating in the virtual visit: patient, provider  I discussed the limitations of evaluation and management by telemedicine and the availability of in person appointments. The patient expressed understanding and agreed to proceed.  Chief Complaint  Patient presents with  . Establish Care    patient states that she have been tested twice for covid both times were negative, per pt she is unable to smell for long periods of times and her ears feel clogged up. Patient states that she is just now getting over a cold she's just afraid there might be something else going on.      HPI: Amanda Hernandez is a 44 y.o. female who complains of nasal congestion and pressure, facial pressure, PND, decreased hearing Lt > Rt for the past 1-2 wks,. She also endorses a mild cough to clear her throat and intermittent loss/decrease in smell. Taste is fine. No fever, chills. She tested negative for covid x 2. She has been using Afrin PRN and now consistently x 3 days. She state she was sick with "flu-like symptoms" 3 wks ago - flu and covid negative at that time - but above symptoms have lingered.  Past Medical History:  Diagnosis Date  . Anxiety   . Depression   . Lyme disease   . PUD (peptic ulcer disease)   . Sleep apnea     Past Surgical History:  Procedure Laterality Date  . No prior surgery      Family History  Problem Relation Age of Onset  . Post-traumatic stress disorder Mother   . Diabetes Father   . Hypertension Father     Social History   Tobacco Use  . Smoking status: Former Games developer  . Smokeless tobacco: Never Used  Substance Use Topics  . Alcohol use: Yes    Comment: Rarely  . Drug use: Never      Current Outpatient Medications:  .  diazepam (VALIUM) 5 MG tablet, Take 2.5 mg by mouth as needed for anxiety., Disp: , Rfl:  .  Fe Fum-FePoly-Vit C-Vit B3 (INTEGRA PO), Take by mouth., Disp: , Rfl:  .  fexofenadine (ALLEGRA) 180 MG tablet, Take 180 mg by mouth daily., Disp: , Rfl:  .  methylphenidate 27 MG PO CR tablet, Take 27 mg by mouth every morning., Disp: , Rfl:  .  Potassium 99 MG TABS, Take 1 tablet by mouth daily., Disp: , Rfl:  .  ondansetron (ZOFRAN ODT) 4 MG disintegrating tablet, Take 1 tablet (4 mg total) by mouth every 8 (eight) hours as needed for nausea or vomiting. (Patient not taking: Reported on 02/20/2019), Disp: 20 tablet, Rfl: 0  Allergies  Allergen Reactions  . Sertraline Anxiety, Hypertension, Nausea And Vomiting, Other (See Comments), Palpitations, Photosensitivity, Shortness Of Breath and Tinitus  . Trazodone And Nefazodone     anxiety  . Tylenol With Codeine #3 [Acetaminophen-Codeine]       ROS: See pertinent positives and negatives per HPI.   EXAM:  VITALS per patient if applicable: Temp (!) 97.5 F (36.4 C) (Tympanic)   Ht 6\' 1"  (1.854 m)   Wt 210 lb (95.3 kg) Comment: per pt  BMI 27.71 kg/m    GENERAL: alert, oriented, appears well and in no acute distress  HEENT:  atraumatic, conjunctiva clear, no obvious abnormalities on inspection of external nose and ears  NECK: normal movements of the head and neck  LUNGS: on inspection no signs of respiratory distress, breathing rate appears normal, no obvious gross SOB, gasping or wheezing, no conversational dyspnea  CV: no obvious cyanosis  PSYCH/NEURO: pleasant and cooperative, speech and thought processing grossly intact   ASSESSMENT AND PLAN: 1. Acute non-recurrent sinusitis, unspecified location - stop regular use of Afrin - use nasal saline spray 3x/day - add flonase daily - increase water intake Rx: - methylPREDNISolone (MEDROL DOSEPAK) 4 MG TBPK tablet; Take as directed  Dispense:  21 tablet; Refill: 0 - f/u if symptoms worsen or do not improve in 2 wks Discussed plan and reviewed medications with patient, including risks, benefits, and potential side effects. Pt expressed understand. All questions answered.     I discussed the assessment and treatment plan with the patient. The patient was provided an opportunity to ask questions and all were answered. The patient agreed with the plan and demonstrated an understanding of the instructions.   The patient was advised to call back or seek an in-person evaluation if the symptoms worsen or if the condition fails to improve as anticipated.   Letta Median, DO

## 2019-02-27 ENCOUNTER — Encounter (INDEPENDENT_AMBULATORY_CARE_PROVIDER_SITE_OTHER): Payer: Self-pay

## 2019-02-27 ENCOUNTER — Encounter: Payer: Self-pay | Admitting: Family Medicine

## 2019-02-27 NOTE — Telephone Encounter (Signed)
Please see message and advise.  Thank you. ° °

## 2019-02-28 ENCOUNTER — Telehealth (INDEPENDENT_AMBULATORY_CARE_PROVIDER_SITE_OTHER): Payer: BC Managed Care – PPO | Admitting: Family Medicine

## 2019-02-28 ENCOUNTER — Encounter: Payer: Self-pay | Admitting: Family Medicine

## 2019-02-28 VITALS — HR 86 | Temp 97.5°F | Ht 73.0 in

## 2019-02-28 DIAGNOSIS — R438 Other disturbances of smell and taste: Secondary | ICD-10-CM | POA: Diagnosis not present

## 2019-02-28 DIAGNOSIS — H938X3 Other specified disorders of ear, bilateral: Secondary | ICD-10-CM | POA: Diagnosis not present

## 2019-02-28 DIAGNOSIS — R0789 Other chest pain: Secondary | ICD-10-CM

## 2019-02-28 NOTE — Progress Notes (Signed)
Virtual Visit via Video Note  I connected with Amanda Hernandez on 02/28/19 at 10:00 AM EST by a video enabled telemedicine application and verified that I am speaking with the correct person using two identifiers. Location patient: home Location provider: home office Persons participating in the virtual visit: patient, provider  I discussed the limitations of evaluation and management by telemedicine and the availability of in person appointments. The patient expressed understanding and agreed to proceed.  Chief Complaint  Patient presents with  . Follow-up    pt c/o of chest pain with movement all day--feel like ballon or air bubble--sharp pain even when laugh,left wrist painful/still does not have sense of smell back,left ear still full/not sure if this a side effects of abx?      HPI: Amanda Hernandez is a 44 y.o. female seen virtually today for multiple concerns: 1. Lt chest "prickling", sometimes sharp pain. Minimal at rest, more noticeable with certain movements and laughing. No symptoms with deep breath. Denies SOB but says her chest may feel tight. No associated n/v, diaphoresis, radiation of pain. No increased belching, heartburn, etc. Pt states symptoms less noticeable today compared to yesterday and the past few days. She wonders if symptoms are reaction to abx and/or steroid.  Pt is a former smoker. Fam h/o HTN - father.  Pt does have anxiety and states she took 1.5 tabs of valium last night and symptoms improved and she was able to sleep.  2. Lt wrist pain/ "prickling" - much improved. Not issues with movement of wrist or use of wrist today.  3. Still with decreased sense of smell and Lt ear fullness. She completed abx, medrol dose pack and has been using flonase.   Past Medical History:  Diagnosis Date  . Anxiety   . Depression   . Lyme disease   . PUD (peptic ulcer disease)   . Sleep apnea     Past Surgical History:  Procedure Laterality Date  . No prior surgery       Family History  Problem Relation Age of Onset  . Post-traumatic stress disorder Mother   . Diabetes Father   . Hypertension Father     Social History   Tobacco Use  . Smoking status: Former Research scientist (life sciences)  . Smokeless tobacco: Never Used  Substance Use Topics  . Alcohol use: Yes    Comment: Rarely  . Drug use: Never     Current Outpatient Medications:  .  diazepam (VALIUM) 5 MG tablet, Take 2.5 mg by mouth as needed for anxiety., Disp: , Rfl:  .  Fe Fum-FePoly-Vit C-Vit B3 (INTEGRA PO), Take by mouth., Disp: , Rfl:  .  methylphenidate 27 MG PO CR tablet, Take 27 mg by mouth every morning., Disp: , Rfl:  .  ondansetron (ZOFRAN ODT) 4 MG disintegrating tablet, Take 1 tablet (4 mg total) by mouth every 8 (eight) hours as needed for nausea or vomiting., Disp: 20 tablet, Rfl: 0 .  Potassium 99 MG TABS, Take 1 tablet by mouth daily., Disp: , Rfl:  .  fexofenadine (ALLEGRA) 180 MG tablet, Take 180 mg by mouth daily., Disp: , Rfl:  .  methylPREDNISolone (MEDROL DOSEPAK) 4 MG TBPK tablet, Take as directed (Patient not taking: Reported on 02/28/2019), Disp: 21 tablet, Rfl: 0  Allergies  Allergen Reactions  . Sertraline Anxiety, Hypertension, Nausea And Vomiting, Other (See Comments), Palpitations, Photosensitivity, Shortness Of Breath and Tinitus  . Trazodone And Nefazodone     anxiety  . Tylenol With Codeine #3 [  Acetaminophen-Codeine]       ROS: See pertinent positives and negatives per HPI.   EXAM:  VITALS per patient if applicable: Pulse 86 Comment: pt reprt  Temp (!) 97.5 F (36.4 C) (Tympanic) Comment: pt report  Ht 6\' 1"  (1.854 m)   BMI 27.71 kg/m    GENERAL: alert, oriented, appears well and in no acute distress  HEENT: atraumatic, conjunctiva clear, no obvious abnormalities on inspection of external nose and ears  NECK: normal movements of the head and neck  LUNGS: on inspection no signs of respiratory distress, breathing rate appears normal, no obvious gross SOB,  gasping or wheezing, no conversational dyspnea  CV: no obvious cyanosis  PSYCH/NEURO: pleasant and cooperative, speech and thought processing grossly intact   ASSESSMENT AND PLAN:  1. Decreased sense of smell 2. Ear fullness, bilateral - symptoms x 3-4 wks, minimal improvement with medrol dose pack, abx, flonase, nasal saline spray. COVID testing negative. - Ambulatory referral to ENT  3. Atypical chest pain - difficult to fully understand pts symptoms but could be medication reaction (oral steroid and/or abx), anxiety, MSK, or cardiac in nature - heat/ice, NSAIDs - cont with meditation and deep breathing exercises, PRN valium for anxiety - if no/minimal improvement in next 24-30 hrs, pt will need in-person eval and/or referral to cardio    I discussed the assessment and treatment plan with the patient. The patient was provided an opportunity to ask questions and all were answered. The patient agreed with the plan and demonstrated an understanding of the instructions.   The patient was advised to call back or seek an in-person evaluation if the symptoms worsen or if the condition fails to improve as anticipated.   , DO

## 2019-03-05 ENCOUNTER — Encounter: Payer: Self-pay | Admitting: Family Medicine

## 2019-03-13 ENCOUNTER — Encounter: Payer: Self-pay | Admitting: Family Medicine

## 2019-03-29 ENCOUNTER — Ambulatory Visit: Payer: BC Managed Care – PPO | Attending: Internal Medicine

## 2019-03-29 DIAGNOSIS — Z23 Encounter for immunization: Secondary | ICD-10-CM | POA: Insufficient documentation

## 2019-03-29 NOTE — Progress Notes (Signed)
   Covid-19 Vaccination Clinic  Name:  Amanda Hernandez    MRN: [id number] DOB: 09-Aug-1975  03/29/2019  [name] was observed post Covid-19 immunization for 15 minutes without incident. She was provided with Vaccine Information Sheet and instruction to access the V-Safe system.   [name] was instructed to call 911 with any severe reactions post vaccine: Marland Kitchen Difficulty breathing  . Swelling of face and throat  . A fast heartbeat  . A bad rash all over body  . Dizziness and weakness

## 2019-04-04 ENCOUNTER — Ambulatory Visit: Payer: BC Managed Care – PPO

## 2019-04-23 ENCOUNTER — Other Ambulatory Visit: Payer: Self-pay

## 2019-04-24 ENCOUNTER — Ambulatory Visit: Payer: BC Managed Care – PPO | Attending: Internal Medicine

## 2019-04-24 ENCOUNTER — Ambulatory Visit: Payer: BC Managed Care – PPO | Admitting: Family Medicine

## 2019-04-24 DIAGNOSIS — Z23 Encounter for immunization: Secondary | ICD-10-CM

## 2019-04-24 NOTE — Progress Notes (Signed)
   Covid-19 Vaccination Clinic  Name:  Spruha Weight    MRN: [id number] DOB: [birth year]  04/24/2019  Ms. Planck was observed post Covid-19 immunization for 15 minutes without incident. She was provided with Vaccine Information Sheet and instruction to access the V-Safe system.   Ms. Korenek was instructed to call 911 with any severe reactions post vaccine: Marland Kitchen Difficulty breathing  . Swelling of face and throat  . A fast heartbeat  . A bad rash all over body  . Dizziness and weakness   Immunizations Administered    Name Date Dose VIS Date Route   Pfizer COVID-19 Vaccine 04/24/2019  1:02 PM 0.3 mL 01/04/2019 Intramuscular   Manufacturer: [employer], [employer]   Lot: [id number]   NDC: [address]-6

## 2019-05-07 ENCOUNTER — Other Ambulatory Visit: Payer: Self-pay

## 2019-05-08 ENCOUNTER — Encounter: Payer: Self-pay | Admitting: Family Medicine

## 2019-05-08 ENCOUNTER — Ambulatory Visit: Payer: BC Managed Care – PPO | Admitting: Family Medicine

## 2019-05-08 VITALS — BP 120/80 | HR 82 | Temp 98.3°F | Ht 73.0 in | Wt 201.8 lb

## 2019-05-08 DIAGNOSIS — M791 Myalgia, unspecified site: Secondary | ICD-10-CM

## 2019-05-08 DIAGNOSIS — R5383 Other fatigue: Secondary | ICD-10-CM

## 2019-05-08 NOTE — Progress Notes (Signed)
Amanda Hernandez is a 44 y.o. female  Chief Complaint  Patient presents with  . Follow-up    Pt c/o low white blood cell count.  Pt recieved 2nd dose of Covid vaccine 04/24/19, pt still feeling fatigue, and chest palpatations.    HPI: Amanda Hernandez is a 44 y.o. female here to f/u on labs done at [facility] on [address]. She states she was called with results a few days prior to 3/31 and she has a "low blood count". She had second COVID vaccine on 3/31. Since that time, increased generalized fatigue, muscle fatigue, palpitations. She states her muscles "feel like jell-o" and she only has enough energy to get out of bed, shower, make food.   Past Medical History:  Diagnosis Date  . Anxiety   . Depression   . Lyme disease   . PUD (peptic ulcer disease)   . Sleep apnea     Past Surgical History:  Procedure Laterality Date  . No prior surgery      Social History   Socioeconomic History  . Marital status: Single    Spouse name: Not on file  . Number of children: Not on file  . Years of education: Not on file  . Highest education level: Not on file  Occupational History  . Not on file  Tobacco Use  . Smoking status: Former Games developer  . Smokeless tobacco: Never Used  Substance and Sexual Activity  . Alcohol use: Yes    Comment: Rarely  . Drug use: Never  . Sexual activity: Never  Other Topics Concern  . Not on file  Social History Narrative  . Not on file   Social Determinants of Health   Financial Resource Strain:   . Difficulty of Paying Living Expenses:   Food Insecurity:   . Worried About Programme researcher, broadcasting/film/video in the Last Year:   . Barista in the Last Year:   Transportation Needs:   . Freight forwarder (Medical):   Marland Kitchen Lack of Transportation (Non-Medical):   Physical Activity:   . Days of Exercise per Week:   . Minutes of Exercise per Session:   Stress:   . Feeling of Stress :   Social Connections:   . Frequency of Communication  with Friends and Family:   . Frequency of Social Gatherings with Friends and Family:   . Attends Religious Services:   . Active Member of Clubs or Organizations:   . Attends Banker Meetings:   Marland Kitchen Marital Status:   Intimate Partner Violence:   . Fear of Current or Ex-Partner:   . Emotionally Abused:   Marland Kitchen Physically Abused:   . Sexually Abused:     Family History  Problem Relation Age of Onset  . Post-traumatic stress disorder Mother   . Diabetes Father   . Hypertension Father      Immunization History  Administered Date(s) Administered  . Influenza,inj,Quad PF,6+ Mos 10/19/2018  . PFIZER SARS-COV-2 Vaccination 03/29/2019, 04/24/2019  . Tdap 10/19/2018    Outpatient Encounter Medications as of 05/08/2019  Medication Sig  . diazepam (VALIUM) 5 MG tablet Take 2.5 mg by mouth as needed for anxiety.  . Fe Fum-FePoly-Vit C-Vit B3 (INTEGRA PO) Take by mouth.  . fexofenadine (ALLEGRA) 180 MG tablet Take 180 mg by mouth daily.  . methylphenidate 27 MG PO CR tablet Take 27 mg by mouth every morning.  . methylPREDNISolone (MEDROL DOSEPAK) 4 MG TBPK tablet Take as directed  .  ondansetron (ZOFRAN ODT) 4 MG disintegrating tablet Take 1 tablet (4 mg total) by mouth every 8 (eight) hours as needed for nausea or vomiting.  . Potassium 99 MG TABS Take 1 tablet by mouth daily.  Marland Kitchen VIIBRYD 20 MG TABS Take 1 tablet by mouth daily.   No facility-administered encounter medications on file as of 05/08/2019.     ROS: Pertinent positives and negatives noted in HPI. Remainder of ROS non-contributory    Allergies  Allergen Reactions  . Sertraline Anxiety, Hypertension, Nausea And Vomiting, Other (See Comments), Palpitations, Photosensitivity, Shortness Of Breath and Tinitus  . Trazodone And Nefazodone     anxiety  . Tylenol With Codeine #3 [Acetaminophen-Codeine]     BP 120/80 (BP Location: Left Arm, Patient Position: Sitting, Cuff Size: Normal)   Pulse 82   Temp 98.3 F (36.8  C) (Temporal)   Ht 6\' 1"  (1.854 m)   Wt 201 lb 12.8 oz (91.5 kg)   LMP 04/13/2019   SpO2 98%   BMI 26.62 kg/m   Physical Exam  Constitutional: She is oriented to person, place, and time. She appears well-developed and well-nourished.  Musculoskeletal:        General: Tenderness present. No edema. Normal range of motion.  Neurological: She is alert and oriented to person, place, and time. Coordination and gait normal.  Skin: Skin is warm and dry.  Psychiatric: She has a normal mood and affect. Her behavior is normal.     A/P:  1. Fatigue, unspecified type - pt associates increased fatigue, weakness onset with second covid vaccine on 3/31 - pt also has anxiety and depression - unclear of how well managed these are - so these could be playing a role - CBC w/Diff - Vitamin B12 - VITAMIN D 25 Hydroxy (Vit-D Deficiency, Fractures) - Iron, TIBC and Ferritin Panel - TSH - Basic metabolic panel - CK Total (and CKMB) - regular exercise could be helpful, continue with adequate hydration, good nutrition - will send pt MyChart message once results available  2. Myalgia - CK Total (and CKMB) - multiple tenderpoints on exam - ? Fibromyalgia +/- chronic fatigue  This visit occurred during the SARS-CoV-2 public health emergency.  Safety protocols were in place, including screening questions prior to the visit, additional usage of staff PPE, and extensive cleaning of exam room while observing appropriate contact time as indicated for disinfecting solutions.

## 2019-05-09 ENCOUNTER — Encounter: Payer: Self-pay | Admitting: Family Medicine

## 2019-05-09 DIAGNOSIS — R790 Abnormal level of blood mineral: Secondary | ICD-10-CM

## 2019-05-09 DIAGNOSIS — E559 Vitamin D deficiency, unspecified: Secondary | ICD-10-CM

## 2019-05-09 LAB — CBC WITH DIFFERENTIAL/PLATELET
Basophils Absolute: 0.1 10*3/uL (ref 0.0–0.1)
Basophils Relative: 1.1 % (ref 0.0–3.0)
Eosinophils Absolute: 0.1 10*3/uL (ref 0.0–0.7)
Eosinophils Relative: 2.4 % (ref 0.0–5.0)
HCT: 37.7 % (ref 36.0–46.0)
Hemoglobin: 12.7 g/dL (ref 12.0–15.0)
Lymphocytes Relative: 37.3 % (ref 12.0–46.0)
Lymphs Abs: 2.3 10*3/uL (ref 0.7–4.0)
MCHC: 33.7 g/dL (ref 30.0–36.0)
MCV: 87.8 fl (ref 78.0–100.0)
Monocytes Absolute: 0.4 10*3/uL (ref 0.1–1.0)
Monocytes Relative: 6.1 % (ref 3.0–12.0)
Neutro Abs: 3.3 10*3/uL (ref 1.4–7.7)
Neutrophils Relative %: 53.1 % (ref 43.0–77.0)
Platelets: 286 10*3/uL (ref 150.0–400.0)
RBC: 4.3 Mil/uL (ref 3.87–5.11)
RDW: 13.2 % (ref 11.5–15.5)
WBC: 6.1 10*3/uL (ref 4.0–10.5)

## 2019-05-09 LAB — BASIC METABOLIC PANEL
BUN: 13 mg/dL (ref 6–23)
CO2: 26 mEq/L (ref 19–32)
Calcium: 8.7 mg/dL (ref 8.4–10.5)
Chloride: 105 mEq/L (ref 96–112)
Creatinine, Ser: 0.91 mg/dL (ref 0.40–1.20)
GFR: 81.92 mL/min (ref >60.00)
Glucose, Bld: 78 mg/dL (ref 70–99)
Potassium: 4 mEq/L (ref 3.5–5.1)
Sodium: 138 mEq/L (ref 135–145)

## 2019-05-09 LAB — IRON,TIBC AND FERRITIN PANEL
%SAT: 15 % (calc) — ABNORMAL LOW (ref 16–45)
Ferritin: 13 ng/mL — ABNORMAL LOW (ref 16–232)
Iron: 53 ug/dL (ref 40–190)
TIBC: 348 mcg/dL (calc) (ref 250–450)

## 2019-05-09 LAB — TSH: TSH: 1.29 u[IU]/mL (ref 0.35–4.50)

## 2019-05-09 LAB — VITAMIN B12: Vitamin B-12: 214 pg/mL (ref 211–911)

## 2019-05-09 LAB — VITAMIN D 25 HYDROXY (VIT D DEFICIENCY, FRACTURES): VITD: 39.24 ng/mL (ref 30.00–100.00)

## 2019-05-09 MED ORDER — IRON-VITAMIN C 65-125 MG PO TABS: 1.0000 | ORAL_TABLET | Freq: Every day | ORAL | 1 refills | Status: AC

## 2019-05-09 MED ORDER — VITAMIN D 50 MCG (2000 UT) PO CAPS: 2000.0000 [IU] | ORAL_CAPSULE | Freq: Every day | ORAL | 3 refills | Status: DC

## 2019-05-09 NOTE — Telephone Encounter (Signed)
Pt will start B12 injection weekly x 4 weeks then once per month x 4 months. Pt will call to schedule nurse visit.

## 2019-05-10 LAB — CK TOTAL AND CKMB (NOT AT ARMC)
CK, MB: <0.7 ng/mL (ref 0–5.0)
Total CK: 57 U/L (ref 29–143)

## 2019-05-13 ENCOUNTER — Other Ambulatory Visit: Payer: Self-pay

## 2019-05-14 ENCOUNTER — Encounter: Payer: Self-pay | Admitting: Family Medicine

## 2019-05-14 ENCOUNTER — Telehealth: Payer: Self-pay | Admitting: Family Medicine

## 2019-05-14 ENCOUNTER — Ambulatory Visit (INDEPENDENT_AMBULATORY_CARE_PROVIDER_SITE_OTHER): Payer: BC Managed Care – PPO

## 2019-05-14 DIAGNOSIS — E538 Deficiency of other specified B group vitamins: Secondary | ICD-10-CM

## 2019-05-14 MED ORDER — CYANOCOBALAMIN 1000 MCG/ML IJ SOLN
1000.0000 ug | Freq: Once | INTRAMUSCULAR | Status: AC
  Administered 2019-05-14: 1000 ug via INTRAMUSCULAR

## 2019-05-14 NOTE — Progress Notes (Signed)
Pt came into the office to get her 1st B12 injection today. Gave injection into the left deltoid, pt tolerated injection well. Next B12 injection scheduled 05/21/2019.   Weekly B12 injection for 4 weeks Monthly B12 injection for 4 months.

## 2019-05-14 NOTE — Telephone Encounter (Signed)
Pt request extended out of work note from 05/09/2019-05/14/2019 (does not work on weekend). Dr. Salena Saner please advise

## 2019-05-14 NOTE — Telephone Encounter (Signed)
Ok to write work note for the requested dates. Thank you!

## 2019-05-15 ENCOUNTER — Encounter: Payer: Self-pay | Admitting: Family Medicine

## 2019-05-15 NOTE — Telephone Encounter (Signed)
Send a message on my chart inform the pt.

## 2019-05-15 NOTE — Progress Notes (Signed)
Medical screening examination/treatment/procedure(s) were performed by the LPN. As primary care provider I was immediately available for consulation/collaboration. I agree with above documentation. Raed Schalk, AGNP-C 

## 2019-05-20 ENCOUNTER — Other Ambulatory Visit: Payer: Self-pay

## 2019-05-21 ENCOUNTER — Ambulatory Visit (INDEPENDENT_AMBULATORY_CARE_PROVIDER_SITE_OTHER): Payer: BC Managed Care – PPO

## 2019-05-21 ENCOUNTER — Telehealth: Payer: Self-pay | Admitting: Family Medicine

## 2019-05-21 DIAGNOSIS — R790 Abnormal level of blood mineral: Secondary | ICD-10-CM

## 2019-05-21 DIAGNOSIS — Z23 Encounter for immunization: Secondary | ICD-10-CM | POA: Diagnosis not present

## 2019-05-21 DIAGNOSIS — E538 Deficiency of other specified B group vitamins: Secondary | ICD-10-CM | POA: Diagnosis not present

## 2019-05-21 MED ORDER — CYANOCOBALAMIN 1000 MCG/ML IJ SOLN
1000.0000 ug | Freq: Once | INTRAMUSCULAR | Status: AC
  Administered 2019-05-21: 09:00:00 1000 ug via INTRAMUSCULAR

## 2019-05-21 NOTE — Progress Notes (Signed)
Medical screening examination/treatment/procedure(s) were performed by the LPN. As primary care provider I was immediately available for consulation/collaboration. I agree with above documentation. Jaclyne Haverstick, AGNP-C 

## 2019-05-21 NOTE — Progress Notes (Signed)
Pt came into the office today get 2nd weekly B12 injection. Gave injection into the left arm, pt tolerated injection well. Next B12 scheduled 05/28/19.   Weekly B12 injection for 4 weeks Monthly B12 injection for 4 months.

## 2019-05-21 NOTE — Telephone Encounter (Signed)
Pt request iron rx call Integra sent into the pharmacy. Pt stated insurance will cover this brand. Please advise

## 2019-05-22 MED ORDER — INTEGRA 62.5-62.5-40-3 MG PO CAPS: ORAL_CAPSULE | ORAL | 3 refills | Status: AC

## 2019-05-22 NOTE — Telephone Encounter (Signed)
Rx sent 

## 2019-05-22 NOTE — Telephone Encounter (Signed)
Pt is aware.  

## 2019-05-28 ENCOUNTER — Other Ambulatory Visit: Payer: Self-pay

## 2019-05-28 ENCOUNTER — Ambulatory Visit (INDEPENDENT_AMBULATORY_CARE_PROVIDER_SITE_OTHER): Payer: BC Managed Care – PPO

## 2019-05-28 DIAGNOSIS — E538 Deficiency of other specified B group vitamins: Secondary | ICD-10-CM | POA: Diagnosis not present

## 2019-05-28 MED ORDER — CYANOCOBALAMIN 1000 MCG/ML IJ SOLN
1000.0000 ug | Freq: Once | INTRAMUSCULAR | Status: AC
  Administered 2019-05-28: 1000 ug via INTRAMUSCULAR

## 2019-05-28 NOTE — Progress Notes (Addendum)
After obtaining consent, and per orders of Dr. Barron Alvine, injection of B-12 given in left feltoid by Emri Sample Jule Ser. Patient instructed to remain in clinic for 20 minutes afterwards, and to report any adverse reaction to me immediately. I have collaborated with the care management provider regarding care management and care coordination activities outlined in this encounter and have reviewed this encounter including documentation in the note and care plan. I am certifying that I agree with the content of this note and encounter as supervising physician.

## 2019-06-03 ENCOUNTER — Other Ambulatory Visit: Payer: Self-pay

## 2019-06-04 ENCOUNTER — Ambulatory Visit (INDEPENDENT_AMBULATORY_CARE_PROVIDER_SITE_OTHER): Payer: BC Managed Care – PPO

## 2019-06-04 DIAGNOSIS — E538 Deficiency of other specified B group vitamins: Secondary | ICD-10-CM

## 2019-06-04 MED ORDER — CYANOCOBALAMIN 1000 MCG/ML IJ SOLN
1000.0000 ug | Freq: Once | INTRAMUSCULAR | Status: AC
  Administered 2019-06-04: 1000 ug via INTRAMUSCULAR

## 2019-06-04 NOTE — Progress Notes (Signed)
After obtaining consent, and per orders of Dr. Cirigliano, injection of B-12 given in left deltoid by Ellanie Oppedisano M Zoella Roberti. Patient instructed to remain in clinic for 20 minutes afterwards, and to report any adverse reaction to me immediately.  

## 2019-07-03 ENCOUNTER — Other Ambulatory Visit: Payer: Self-pay

## 2019-07-04 ENCOUNTER — Ambulatory Visit (INDEPENDENT_AMBULATORY_CARE_PROVIDER_SITE_OTHER): Payer: BC Managed Care – PPO

## 2019-07-04 DIAGNOSIS — E538 Deficiency of other specified B group vitamins: Secondary | ICD-10-CM | POA: Diagnosis not present

## 2019-07-04 MED ORDER — CYANOCOBALAMIN 1000 MCG/ML IJ SOLN
1000.0000 ug | Freq: Once | INTRAMUSCULAR | Status: AC
  Administered 2019-07-04: 1000 ug via INTRAMUSCULAR

## 2019-07-04 NOTE — Progress Notes (Signed)
I reviewed and agree with the documentation and plan as outlined below.   

## 2019-07-04 NOTE — Progress Notes (Signed)
After obtaining consent, and per orders of Dr. Cirigliano, injection of B-12 given in left deltoid by D M Frederick. Patient instructed to remain in clinic for 20 minutes afterwards, and to report any adverse reaction to me immediately.  

## 2019-08-05 ENCOUNTER — Other Ambulatory Visit: Payer: Self-pay

## 2019-08-06 ENCOUNTER — Ambulatory Visit (INDEPENDENT_AMBULATORY_CARE_PROVIDER_SITE_OTHER): Payer: BC Managed Care – PPO

## 2019-08-06 DIAGNOSIS — E538 Deficiency of other specified B group vitamins: Secondary | ICD-10-CM | POA: Diagnosis not present

## 2019-08-06 MED ORDER — CYANOCOBALAMIN 1000 MCG/ML IJ SOLN
1000.0000 ug | Freq: Once | INTRAMUSCULAR | Status: AC
  Administered 2019-08-06: 1000 ug via INTRAMUSCULAR

## 2019-08-06 NOTE — Progress Notes (Signed)
Pt came in for her B12 shot that is given monthly now, pt got injection in the left deltoid, pt tolerated the injection good.

## 2019-08-23 NOTE — Progress Notes (Signed)
I reviewed and agree with the documentation and plan as outlined below.   

## 2019-09-03 ENCOUNTER — Ambulatory Visit (INDEPENDENT_AMBULATORY_CARE_PROVIDER_SITE_OTHER): Payer: BC Managed Care – PPO

## 2019-09-03 ENCOUNTER — Other Ambulatory Visit: Payer: Self-pay

## 2019-09-03 DIAGNOSIS — E538 Deficiency of other specified B group vitamins: Secondary | ICD-10-CM

## 2019-09-03 MED ORDER — CYANOCOBALAMIN 1000 MCG/ML IJ SOLN
1000.0000 ug | Freq: Once | INTRAMUSCULAR | Status: AC
  Administered 2019-09-03: 1000 ug via INTRAMUSCULAR

## 2019-09-03 NOTE — Progress Notes (Signed)
..  After obtaining consent, and per orders of Dr. Jennye Moccasin, injection of B12 given by [place] right deltoid. Patient instructed to remain in clinic for 20 minutes afterwards, and to report any adverse reaction to me immediately.

## 2019-09-03 NOTE — Progress Notes (Signed)
Medical screening examination/treatment/procedure(s) were performed by the CMA. As primary care provider I was immediately available for consulation/collaboration. I agree with above documentation. Arwin Bisceglia, AGNP-C 

## 2019-09-11 ENCOUNTER — Ambulatory Visit: Payer: BC Managed Care – PPO

## 2019-09-12 ENCOUNTER — Ambulatory Visit: Payer: BC Managed Care – PPO

## 2019-09-25 ENCOUNTER — Other Ambulatory Visit: Payer: Self-pay | Admitting: Obstetrics and Gynecology

## 2019-09-25 DIAGNOSIS — Z1231 Encounter for screening mammogram for malignant neoplasm of breast: Secondary | ICD-10-CM

## 2019-10-08 ENCOUNTER — Ambulatory Visit: Payer: BC Managed Care – PPO

## 2019-10-29 ENCOUNTER — Ambulatory Visit: Payer: BC Managed Care – PPO

## 2020-01-08 ENCOUNTER — Ambulatory Visit: Payer: Self-pay

## 2020-01-28 ENCOUNTER — Other Ambulatory Visit: Payer: Self-pay

## 2020-01-28 ENCOUNTER — Other Ambulatory Visit (HOSPITAL_COMMUNITY): Payer: Self-pay | Admitting: Obstetrics and Gynecology

## 2020-01-29 ENCOUNTER — Ambulatory Visit (INDEPENDENT_AMBULATORY_CARE_PROVIDER_SITE_OTHER): Payer: 59

## 2020-01-29 DIAGNOSIS — E538 Deficiency of other specified B group vitamins: Secondary | ICD-10-CM

## 2020-01-29 MED ORDER — CYANOCOBALAMIN 1000 MCG/ML IJ SOLN
1000.0000 ug | Freq: Once | INTRAMUSCULAR | Status: AC
  Administered 2020-01-31: 1000 ug via INTRAMUSCULAR

## 2020-01-29 NOTE — Progress Notes (Signed)
Per orders of Dr. Cirigliano injection of B12 given by Brendell L Tyus in left deltoid. Patient tolerated injection well. No signs or symptoms of a reaction were noted prior to patient leaving the nurse visit. Patient will make appointment for 1 month.  

## 2020-01-29 NOTE — Patient Instructions (Signed)
Health Maintenance Due  Topic Date Due  . Hepatitis C Screening  Never done  . HIV Screening  Never done  . INFLUENZA VACCINE  08/25/2019    Depression screen [facility] 2/9 02/20/2019 10/19/2018  Decreased Interest 2 2  Down, Depressed, Hopeless 2 2  PHQ - 2 Score 4 4  Altered sleeping 1 3  Tired, decreased energy 1 3  Change in appetite 2 3  Feeling bad or failure about yourself  0 2  Trouble concentrating 1 3  Moving slowly or fidgety/restless 0 2  Suicidal thoughts 0 0  PHQ-9 Score 9 20

## 2020-01-31 DIAGNOSIS — E538 Deficiency of other specified B group vitamins: Secondary | ICD-10-CM | POA: Diagnosis not present

## 2020-03-16 ENCOUNTER — Other Ambulatory Visit: Payer: Self-pay

## 2020-03-17 ENCOUNTER — Ambulatory Visit (INDEPENDENT_AMBULATORY_CARE_PROVIDER_SITE_OTHER): Payer: 59

## 2020-03-17 DIAGNOSIS — E538 Deficiency of other specified B group vitamins: Secondary | ICD-10-CM | POA: Diagnosis not present

## 2020-03-17 MED ORDER — CYANOCOBALAMIN 1000 MCG/ML IJ SOLN
1000.0000 ug | Freq: Once | INTRAMUSCULAR | Status: AC
  Administered 2020-03-17: 1000 ug via INTRAMUSCULAR

## 2020-03-17 NOTE — Progress Notes (Signed)
Per orders of Dr. Salena Saner pt is here for B12 injection, pt received injection in left deltoid. Pt tolerated injection well. Pt to return in around 23mo for next injection

## 2020-04-20 ENCOUNTER — Other Ambulatory Visit (HOSPITAL_COMMUNITY): Payer: Self-pay | Admitting: Obstetrics and Gynecology

## 2020-04-20 ENCOUNTER — Other Ambulatory Visit: Payer: Self-pay

## 2020-04-21 ENCOUNTER — Ambulatory Visit (INDEPENDENT_AMBULATORY_CARE_PROVIDER_SITE_OTHER): Payer: 59

## 2020-04-21 DIAGNOSIS — E538 Deficiency of other specified B group vitamins: Secondary | ICD-10-CM | POA: Diagnosis not present

## 2020-04-21 MED ORDER — CYANOCOBALAMIN 1000 MCG/ML IJ SOLN
1000.0000 ug | Freq: Once | INTRAMUSCULAR | Status: AC
  Administered 2020-04-21: 1000 ug via INTRAMUSCULAR

## 2020-04-21 NOTE — Progress Notes (Signed)
per orders of Dr. Barron Alvine, pt is here for B12 injection. pt received injection in left deltoid. pt  tolerated B12 injection well. injection was given by [place] CMA/CPT.

## 2020-04-22 ENCOUNTER — Ambulatory Visit: Payer: 59 | Admitting: Family Medicine

## 2020-05-20 ENCOUNTER — Other Ambulatory Visit: Payer: Self-pay

## 2020-05-21 ENCOUNTER — Encounter: Payer: Self-pay | Admitting: Family Medicine

## 2020-05-21 ENCOUNTER — Ambulatory Visit (INDEPENDENT_AMBULATORY_CARE_PROVIDER_SITE_OTHER): Payer: 59 | Admitting: Family Medicine

## 2020-05-21 VITALS — BP 122/100 | HR 113 | Temp 98.8°F | Ht 73.0 in | Wt 204.6 lb

## 2020-05-21 DIAGNOSIS — F0781 Postconcussional syndrome: Secondary | ICD-10-CM | POA: Diagnosis not present

## 2020-05-21 DIAGNOSIS — H5462 Unqualified visual loss, left eye, normal vision right eye: Secondary | ICD-10-CM | POA: Diagnosis not present

## 2020-05-21 MED ORDER — MECLIZINE HCL 25 MG PO TABS: 25.0000 mg | ORAL_TABLET | Freq: Three times a day (TID) | ORAL | 0 refills | Status: DC | PRN

## 2020-05-21 MED ORDER — ONDANSETRON 4 MG PO TBDP: 4.0000 mg | ORAL_TABLET | Freq: Three times a day (TID) | ORAL | 0 refills | Status: DC | PRN

## 2020-05-21 MED ORDER — NAPROXEN 500 MG PO TABS: 500.0000 mg | ORAL_TABLET | Freq: Two times a day (BID) | ORAL | 1 refills | Status: DC

## 2020-05-21 MED ORDER — SUMATRIPTAN SUCCINATE 50 MG PO TABS: ORAL_TABLET | ORAL | 1 refills | Status: DC

## 2020-05-21 NOTE — Progress Notes (Signed)
Amanda Hernandez is a 45 y.o. female  Chief Complaint  Patient presents with  . Acute Visit    Pt c/o gas leak at work, pt had hit head head on lt side, pt said that she has been dizzy and having headaches everyday since the accident.  Happening on March 24,2022.  Pt has been in and out of ER and UC since head injury.    HPI: Amanda Hernandez is a 45 y.o. female seen today for the following Chief Complaint  Patient presents with  . Acute Visit    Pt c/o gas leak at work, pt had hit head head on lt side, pt said that she has been dizzy and having headaches everyday since the accident.  Happening on March 24,2022.  Pt has been in and out of ER and UC since head injury.    This is a worker's comp case. She is accompanied by Luz Brazen (case manager with [employer]). Note from today needs to be faxed to [facility] [phone].  Gas leak at work which caused pt to have near-syncopal vs syncopal episode and hit her head on a metal pole. Occurred on 04/16/20. Since that time pt complains of  Lt vision blurry Headache - constant but worse at times Lightheadedness with occasional dizziness (pt reports she is mainly dizzy when driving or with quick head movements) Trouble focusing to play video games, watch tv Fatigue Nausea and occasional vomiting  She was seen in ER x 2.  Given prednisone, zofran, baclofen - pt states she had ? Allergic reaction - palpitations, throat closing - so stopped taking baclofen and prednisone CT head was done and normal.  Has neuro appt on 07/15/20 Ophthalmology - not yet scheduled   ER notes, results reviewed by me today  Pt tried to drive to work yesterday and became dizzy. Also has increased intensity of headache after work (light restriction but able to bend, lift) where she has to wait in the car 2+ hrs before feeling well enough to drive home.    Past Medical History:  Diagnosis Date  . Anxiety   . Depression   . Lyme disease   . PUD  (peptic ulcer disease)   . Sleep apnea     Past Surgical History:  Procedure Laterality Date  . No prior surgery      Social History   Socioeconomic History  . Marital status: Single    Spouse name: Not on file  . Number of children: Not on file  . Years of education: Not on file  . Highest education level: Not on file  Occupational History  . Not on file  Tobacco Use  . Smoking status: Former Games developer  . Smokeless tobacco: Never Used  Substance and Sexual Activity  . Alcohol use: Yes    Comment: Rarely  . Drug use: Never  . Sexual activity: Never  Other Topics Concern  . Not on file  Social History Narrative  . Not on file   Social Determinants of Health   Financial Resource Strain: Not on file  Food Insecurity: Not on file  Transportation Needs: Not on file  Physical Activity: Not on file  Stress: Not on file  Social Connections: Not on file  Intimate Partner Violence: Not on file    Family History  Problem Relation Age of Onset  . Post-traumatic stress disorder Mother   . Diabetes Father   . Hypertension Father      Immunization History  Administered  Date(s) Administered  . Influenza,inj,Quad PF,6+ Mos 10/19/2018  . PFIZER(Purple Top)SARS-COV-2 Vaccination 03/29/2019, 04/24/2019  . Tdap 10/19/2018    Outpatient Encounter Medications as of 05/21/2020  Medication Sig  . diazepam (VALIUM) 5 MG tablet Take 2.5 mg by mouth as needed for anxiety.  . Fe Fum-FePoly-Vit C-Vit B3 (INTEGRA) 62.5-62.5-40-3 MG CAPS 1 cap po daily  . fexofenadine (ALLEGRA) 180 MG tablet Take 180 mg by mouth daily.  . Iron-Vitamin C 65-125 MG TABS Take 1 tablet by mouth daily.  . medroxyPROGESTERone (DEPO-PROVERA) 150 MG/ML injection Inject 150 mg into the muscle every 3 (three) months.  . ondansetron (ZOFRAN ODT) 4 MG disintegrating tablet Take 1 tablet (4 mg total) by mouth every 8 (eight) hours as needed for nausea or vomiting.  . Potassium 99 MG TABS Take 1 tablet by mouth daily.   . Cholecalciferol (VITAMIN D) 50 MCG (2000 UT) CAPS Take 1 capsule (2,000 Units total) by mouth daily. (Patient not taking: Reported on 05/21/2020)  . methylphenidate 27 MG PO CR tablet Take 27 mg by mouth every morning.  Marland Kitchen VIIBRYD 20 MG TABS Take 1 tablet by mouth daily.  . [DISCONTINUED] medroxyPROGESTERone (DEPO-PROVERA) 150 MG/ML injection INJECT 1 ML INTRAMUSCULAR ONCE EVERY 3 MONTHS 90 DAYS  . [DISCONTINUED] medroxyPROGESTERone (DEPO-PROVERA) 150 MG/ML injection INJECT 1 ML INTO THE MUSCLE EVERY 3 MONTHS  . [DISCONTINUED] methylPREDNISolone (MEDROL DOSEPAK) 4 MG TBPK tablet Take as directed   No facility-administered encounter medications on file as of 05/21/2020.     ROS: Pertinent positives and negatives noted in HPI. Remainder of ROS non-contributory    Allergies  Allergen Reactions  . Sertraline Anxiety, Hypertension, Nausea And Vomiting, Other (See Comments), Palpitations, Photosensitivity, Shortness Of Breath and Tinitus  . Trazodone And Nefazodone     anxiety  . Tylenol With Codeine #3 [Acetaminophen-Codeine]     BP (!) 122/100 (BP Location: Left Arm, Patient Position: Sitting, Cuff Size: Normal)   Pulse (!) 113   Temp 98.8 F (37.1 C) (Oral)   Ht 6\' 1"  (1.854 m)   Wt 204 lb 9.6 oz (92.8 kg)   LMP  (LMP Unknown)   SpO2 100%   BMI 26.99 kg/m   Wt Readings from Last 3 Encounters:  05/21/20 204 lb 9.6 oz (92.8 kg)  05/08/19 201 lb 12.8 oz (91.5 kg)  02/20/19 210 lb (95.3 kg)    Physical Exam  CT HEAD WO IV CONTRAST  Narrative:  INDICATION: Dizziness, persistent/recurrent, cardiac or vascular cause suspected,  COMPARISON: None.  TECHNIQUE: Multiple axial images obtained from the skull base to the vertex without IV contrast were obtained on 04/27/2020 9:23 AM  FINDINGS:  Ventricles, sulci, and cisterns appear appropriate in size and configuration. Midline structures are appropriate in position. There is no acute hemorrhage or mass effect. There are no areas  of abnormal parenchymal attenuation identified. No concerning calvarial abnormalities are identified. Paranasal sinuses and mastoid air cells are clear.  Impression:  IMPRESSION: No acute intracranial abnormalities identified.  Electronically Signed by: 06/27/2020 on 04/27/2020 9:36 AM     A/P:  1. Postconcussion syndrome 2. Decreased vision of left eye - fall and head trauma occurred on 04/16/20 at work  - neuro appt scheduled in 06/2020 - Ambulatory referral to Ophthalmology - head CT negative on 04/27/20. Symptoms stable - not getting worse but not improving Rx: - ondansetron (ZOFRAN ODT) 4 MG disintegrating tablet; Take 1 tablet (4 mg total) by mouth every 8 (eight) hours as needed for nausea or vomiting.  Dispense: 30 tablet; Refill: 0 - SUMAtriptan (IMITREX) 50 MG tablet; Take 1 tab po as needed, may repeat x 1 tab in 1-2hrs if headache persist  Dispense: 10 tablet; Refill: 1 - naproxen (NAPROSYN) 500 MG tablet; Take 1 tablet (500 mg total) by mouth 2 (two) times daily with a meal.  Dispense: 30 tablet; Refill: 1 - meclizine (ANTIVERT) 25 MG tablet; Take 1 tablet (25 mg total) by mouth 3 (three) times daily as needed for dizziness.  Dispense: 60 tablet; Refill: 0 - work note given to remain out of work until re-eval in 3-4 wks - f/u in 3-4 wks, sooner PRN Discussed plan and reviewed medications with patient, including risks, benefits, and potential side effects. Pt expressed understand. All questions answered.   This visit occurred during the SARS-CoV-2 public health emergency.  Safety protocols were in place, including screening questions prior to the visit, additional usage of staff PPE, and extensive cleaning of exam room while observing appropriate contact time as indicated for disinfecting solutions.

## 2020-05-21 NOTE — Patient Instructions (Addendum)
Ophthalmology referral [place] - goal of 64-100oz per day Sleep - 8-10hrs per night Limit screen time   [facility] [address] [place], [place], [place] [address] Phone: [phone]  [facility] [address], [place], [place] [address] Phone: [phone]

## 2020-06-16 ENCOUNTER — Encounter: Payer: Self-pay | Admitting: Family Medicine

## 2020-06-18 ENCOUNTER — Other Ambulatory Visit: Payer: Self-pay

## 2020-06-19 ENCOUNTER — Ambulatory Visit (INDEPENDENT_AMBULATORY_CARE_PROVIDER_SITE_OTHER): Payer: 59 | Admitting: Family Medicine

## 2020-06-19 ENCOUNTER — Encounter: Payer: Self-pay | Admitting: Family Medicine

## 2020-06-19 VITALS — BP 120/100 | HR 103 | Temp 98.0°F | Ht 73.0 in | Wt 205.6 lb

## 2020-06-19 DIAGNOSIS — F0781 Postconcussional syndrome: Secondary | ICD-10-CM | POA: Diagnosis not present

## 2020-06-19 MED ORDER — ONDANSETRON 4 MG PO TBDP: 4.0000 mg | ORAL_TABLET | Freq: Three times a day (TID) | ORAL | 0 refills | Status: DC | PRN

## 2020-06-19 MED ORDER — SUMATRIPTAN SUCCINATE 50 MG PO TABS: ORAL_TABLET | ORAL | 1 refills | Status: AC

## 2020-06-19 NOTE — Progress Notes (Signed)
Amanda Hernandez is a 45 y.o. female  Chief Complaint  Patient presents with  . Follow-up    Pt here for 4 week f/u visit.  Pt c/o not getting any better, headaches have gotten better with medication.    HPI: SEE BEHARRY is a 45 y.o. female patient seen today in f/u for postconcussion syndrome after a fall at work. She has been out of work d/t symptoms - headache, dizziness, trouble concentrating, vision issues with Lt eye.  Since last OV about 4 wks ago, pt had MRI brain which was normal other than some minimal paranasal sinus disease/congestion. CT in 04/2020 was WNL. She was seen at [facility] - yesterday. She had appt with neuro ([facility]) on 06/24/20. Ophthalmology appt in about 3-4wks. She is still having headaches, photophobia. Headaches will ease off at times, was consistent previously. Imitrex is helpful for headaches.  She notes humming in Lt ear and this "cuts out" at times and she can't hear. She is sleeping a lot and has to rest often.    Past Medical History:  Diagnosis Date  . Anxiety   . Depression   . Lyme disease   . PUD (peptic ulcer disease)   . Sleep apnea     Past Surgical History:  Procedure Laterality Date  . No prior surgery      Social History   Socioeconomic History  . Marital status: Single    Spouse name: Not on file  . Number of children: Not on file  . Years of education: Not on file  . Highest education level: Not on file  Occupational History  . Not on file  Tobacco Use  . Smoking status: Former Games developer  . Smokeless tobacco: Never Used  Substance and Sexual Activity  . Alcohol use: Yes    Comment: Rarely  . Drug use: Never  . Sexual activity: Never  Other Topics Concern  . Not on file  Social History Narrative  . Not on file   Social Determinants of Health   Financial Resource Strain: Not on file  Food Insecurity: Not on file  Transportation Needs: Not on file  Physical Activity: Not on file  Stress:  Not on file  Social Connections: Not on file  Intimate Partner Violence: Not on file    Family History  Problem Relation Age of Onset  . Post-traumatic stress disorder Mother   . Diabetes Father   . Hypertension Father      Immunization History  Administered Date(s) Administered  . Influenza,inj,Quad PF,6+ Mos 10/19/2018  . PFIZER(Purple Top)SARS-COV-2 Vaccination 03/29/2019, 04/24/2019  . Tdap 10/19/2018    Outpatient Encounter Medications as of 06/19/2020  Medication Sig  . diazepam (VALIUM) 5 MG tablet Take 2.5 mg by mouth as needed for anxiety.  . Fe Fum-FePoly-Vit C-Vit B3 (INTEGRA) 62.5-62.5-40-3 MG CAPS 1 cap po daily  . fexofenadine (ALLEGRA) 180 MG tablet Take 180 mg by mouth daily.  . Iron-Vitamin C 65-125 MG TABS Take 1 tablet by mouth daily.  . meclizine (ANTIVERT) 25 MG tablet Take 1 tablet (25 mg total) by mouth 3 (three) times daily as needed for dizziness.  . medroxyPROGESTERone (DEPO-PROVERA) 150 MG/ML injection Inject 150 mg into the muscle every 3 (three) months.  . naproxen (NAPROSYN) 500 MG tablet Take 1 tablet (500 mg total) by mouth 2 (two) times daily with a meal.  . Potassium 99 MG TABS Take 1 tablet by mouth daily.  . [DISCONTINUED] ondansetron (ZOFRAN ODT) 4 MG  disintegrating tablet Take 1 tablet (4 mg total) by mouth every 8 (eight) hours as needed for nausea or vomiting.  . [DISCONTINUED] SUMAtriptan (IMITREX) 50 MG tablet Take 1 tab po as needed, may repeat x 1 tab in 1-2hrs if headache persist  . ondansetron (ZOFRAN ODT) 4 MG disintegrating tablet Take 1 tablet (4 mg total) by mouth every 8 (eight) hours as needed for nausea or vomiting.  . SUMAtriptan (IMITREX) 50 MG tablet Take 1 tab po as needed, may repeat x 1 tab in 1-2hrs if headache persist   No facility-administered encounter medications on file as of 06/19/2020.     ROS: Pertinent positives and negatives noted in HPI. Remainder of ROS non-contributory    Allergies  Allergen Reactions   . Sertraline Anxiety, Hypertension, Nausea And Vomiting, Other (See Comments), Palpitations, Photosensitivity, Shortness Of Breath and Tinitus  . Trazodone And Nefazodone     anxiety  . Tylenol With Codeine #3 [Acetaminophen-Codeine]     BP (!) 120/100 (BP Location: Left Arm, Patient Position: Sitting, Cuff Size: Normal)   Pulse (!) 103   Temp 98 F (36.7 C) (Temporal)   Ht 6\' 1"  (1.854 m)   Wt 205 lb 9.6 oz (93.3 kg)   LMP  (LMP Unknown)   SpO2 98%   BMI 27.13 kg/m   Wt Readings from Last 3 Encounters:  06/19/20 205 lb 9.6 oz (93.3 kg)  05/21/20 204 lb 9.6 oz (92.8 kg)  05/08/19 201 lb 12.8 oz (91.5 kg)   Temp Readings from Last 3 Encounters:  06/19/20 98 F (36.7 C) (Temporal)  05/21/20 98.8 F (37.1 C) (Oral)  05/08/19 98.3 F (36.8 C) (Temporal)   BP Readings from Last 3 Encounters:  06/19/20 (!) 120/100  05/21/20 (!) 122/100  05/08/19 120/80   Pulse Readings from Last 3 Encounters:  06/19/20 (!) 103  05/21/20 (!) 113  05/08/19 82     Physical Exam Constitutional:      General: She is not in acute distress.    Appearance: She is not ill-appearing.  Eyes:     Extraocular Movements: Extraocular movements intact.     Conjunctiva/sclera: Conjunctivae normal.     Pupils: Pupils are equal, round, and reactive to light.  Neurological:     General: No focal deficit present.     Mental Status: She is alert and oriented to person, place, and time.  Psychiatric:        Behavior: Behavior normal.        Thought Content: Thought content normal.        Judgment: Judgment normal.        A/P:  1. Postconcussion syndrome - has neuro appt on 06/24/20 and ophthalmology appt at end of 06/2020 - normal CT head and MRI brain - still with significant symptoms but headaches are not as constant as they were 4 wks ago Refill: - ondansetron (ZOFRAN ODT) 4 MG disintegrating tablet; Take 1 tablet (4 mg total) by mouth every 8 (eight) hours as needed for nausea or vomiting.   Dispense: 30 tablet; Refill: 0 - SUMAtriptan (IMITREX) 50 MG tablet; Take 1 tab po as needed, may repeat x 1 tab in 1-2hrs if headache persist  Dispense: 10 tablet; Refill: 1 - f/u PRN   This visit occurred during the SARS-CoV-2 public health emergency.  Safety protocols were in place, including screening questions prior to the visit, additional usage of staff PPE, and extensive cleaning of exam room while observing appropriate contact time as indicated for  disinfecting solutions.

## 2020-08-04 ENCOUNTER — Encounter: Payer: Self-pay | Admitting: Family Medicine

## 2020-08-11 ENCOUNTER — Encounter: Payer: Self-pay | Admitting: Family Medicine

## 2020-08-11 LAB — HM DIABETES EYE EXAM

## 2020-08-14 ENCOUNTER — Telehealth (INDEPENDENT_AMBULATORY_CARE_PROVIDER_SITE_OTHER): Payer: 59 | Admitting: Family Medicine

## 2020-08-14 ENCOUNTER — Encounter: Payer: Self-pay | Admitting: Family Medicine

## 2020-08-14 DIAGNOSIS — F0781 Postconcussional syndrome: Secondary | ICD-10-CM

## 2020-08-14 MED ORDER — PROMETHAZINE HCL 25 MG PO TABS: 25.0000 mg | ORAL_TABLET | Freq: Three times a day (TID) | ORAL | 1 refills | Status: AC | PRN

## 2020-08-14 MED ORDER — MECLIZINE HCL 25 MG PO TABS: 25.0000 mg | ORAL_TABLET | Freq: Three times a day (TID) | ORAL | 0 refills | Status: AC | PRN

## 2020-08-14 MED ORDER — NAPROXEN 500 MG PO TABS: 500.0000 mg | ORAL_TABLET | Freq: Two times a day (BID) | ORAL | 1 refills | Status: AC | PRN

## 2020-08-14 NOTE — Patient Instructions (Signed)
Amanda Hernandez Dr. Veto Kemps

## 2020-08-14 NOTE — Progress Notes (Signed)
Virtual Visit via Video Note  I connected with Amanda Hernandez on 08/14/20 at  3:30 PM EDT by a video enabled telemedicine application and verified that I am speaking with the correct person using two identifiers. Location patient: home Location provider: work  Persons participating in the virtual visit: patient, provider  I discussed the limitations of evaluation and management by telemedicine and the availability of in person appointments. The patient expressed understanding and agreed to proceed.  Chief Complaint  Patient presents with   Follow-up    Pt c/o vertigo symptoms, nausea, headaches and dizziness x4 months.    HPI: Amanda Hernandez is a 45 y.o. female patient seen today for f/u on post-concussive syndrome secondary to a syncopal vs near-syncopal episode at work in 03/2020. She has been out of work since that time. She has seen neuro x 2, negative CT head and MRI brain. She also saw ENT.  She saw ophthalmology [facility]) in the past week or so. She was told her eyes/vision are fine. She thinks she needs a second option about this.  Most recently, neuro Dr. Stacy Gardner recommended pt can return to work without restrictions. Pt is unhappy about this and feels it is unsafe for her to do so d/t persistent constellation of symptoms. Dr. Anne Hahn' felt there is malingering on the part of the patient and this is documented in her progress note. She thinks she needs a second opinion outside of [place]. She is going to schedule with neuro in [place].  She needs refills of her medication nausea, dizziness, headache.  Past Medical History:  Diagnosis Date   Anxiety    Depression    Lyme disease    PUD (peptic ulcer disease)    Sleep apnea     Past Surgical History:  Procedure Laterality Date   No prior surgery      Family History  Problem Relation Age of Onset   Post-traumatic stress disorder Mother    Diabetes Father    Hypertension Father     Social History    Tobacco Use   Smoking status: Former   Smokeless tobacco: Never  Substance Use Topics   Alcohol use: Yes    Comment: Rarely   Drug use: Never     Current Outpatient Medications:    diazepam (VALIUM) 5 MG tablet, Take 2.5 mg by mouth as needed for anxiety., Disp: , Rfl:    Fe Fum-FePoly-Vit C-Vit B3 (INTEGRA) 62.5-62.5-40-3 MG CAPS, 1 cap po daily, Disp: 90 capsule, Rfl: 3   fexofenadine (ALLEGRA) 180 MG tablet, Take 180 mg by mouth daily., Disp: , Rfl:    meclizine (ANTIVERT) 25 MG tablet, Take 1 tablet (25 mg total) by mouth 3 (three) times daily as needed for dizziness., Disp: 60 tablet, Rfl: 0   medroxyPROGESTERone (DEPO-PROVERA) 150 MG/ML injection, Inject 150 mg into the muscle every 3 (three) months., Disp: , Rfl:    naproxen (NAPROSYN) 500 MG tablet, Take 1 tablet (500 mg total) by mouth 2 (two) times daily with a meal., Disp: 30 tablet, Rfl: 1   ondansetron (ZOFRAN ODT) 4 MG disintegrating tablet, Take 1 tablet (4 mg total) by mouth every 8 (eight) hours as needed for nausea or vomiting., Disp: 30 tablet, Rfl: 0   Potassium 99 MG TABS, Take 1 tablet by mouth daily., Disp: , Rfl:    SUMAtriptan (IMITREX) 50 MG tablet, Take 1 tab po as needed, may repeat x 1 tab in 1-2hrs if headache persist, Disp: 10  tablet, Rfl: 1   Iron-Vitamin C 65-125 MG TABS, Take 1 tablet by mouth daily. (Patient not taking: Reported on 08/14/2020), Disp: 90 tablet, Rfl: 1  Allergies  Allergen Reactions   Sertraline Anxiety, Hypertension, Nausea And Vomiting, Other (See Comments), Palpitations, Photosensitivity, Shortness Of Breath and Tinitus   Trazodone And Nefazodone     anxiety   Tylenol With Codeine #3 [Acetaminophen-Codeine]       ROS: See pertinent positives and negatives per HPI.   EXAM:  VITALS per patient if applicable: Pulse 100   Ht 6\' 1"  (1.854 m)   Wt 205 lb 9.6 oz (93.3 kg)   LMP 08/03/2020 (Exact Date)   BMI 27.13 kg/m   Wt Readings from Last 3 Encounters:  08/14/20 205 lb  9.6 oz (93.3 kg)  06/19/20 205 lb 9.6 oz (93.3 kg)  05/21/20 204 lb 9.6 oz (92.8 kg)   Temp Readings from Last 3 Encounters:  06/19/20 98 F (36.7 C) (Temporal)  05/21/20 98.8 F (37.1 C) (Oral)  05/08/19 98.3 F (36.8 C) (Temporal)   BP Readings from Last 3 Encounters:  06/19/20 (!) 120/100  05/21/20 (!) 122/100  05/08/19 120/80   Pulse Readings from Last 3 Encounters:  08/14/20 100  06/19/20 (!) 103  05/21/20 (!) 113     GENERAL: alert, oriented, in no acute distress  NECK: normal movements of the head and neck  LUNGS: on inspection no signs of respiratory distress, breathing rate appears normal, no obvious gross SOB, gasping or wheezing, no conversational dyspnea  CV: no obvious cyanosis  MS: moves all visible extremities without noticeable abnormality  PSYCH/NEURO: pleasant and cooperative, speech and thought processing grossly intact   ASSESSMENT AND PLAN:  1. Postconcussion syndrome - has seen ophthalmology 05/23/20) and was told normal exam, neuro x 2 providers ([facility]) and was released to return to work, however pt is seeking another opinion from both ophtho and neuro Refill: - meclizine (ANTIVERT) 25 MG tablet; Take 1 tablet (25 mg total) by mouth 3 (three) times daily as needed for dizziness.  Dispense: 90 tablet; Refill: 0 - naproxen (NAPROSYN) 500 MG tablet; Take 1 tablet (500 mg total) by mouth 2 (two) times daily as needed.  Dispense: 60 tablet; Refill: 1 - promethazine (PHENERGAN) 25 MG tablet; Take 1 tablet (25 mg total) by mouth every 8 (eight) hours as needed for nausea or vomiting.  Dispense: 30 tablet; Refill: 1   I discussed the assessment and treatment plan with the patient. The patient was provided an opportunity to ask questions and all were answered. The patient agreed with the plan and demonstrated an understanding of the instructions.   The patient was advised to call back or seek an in-person evaluation if the symptoms worsen or if the  condition fails to improve as anticipated.   Dione Booze, DO

## 2020-09-14 ENCOUNTER — Encounter: Payer: Self-pay | Admitting: Family Medicine

## 2020-10-23 IMAGING — DX CHEST - 2 VIEW
2 series · 2 of 2 positions shown · non-contrast
Comparison: None.

CLINICAL DATA: Left-sided chest pain

EXAM:
CHEST - 2 VIEW

[chest lat]
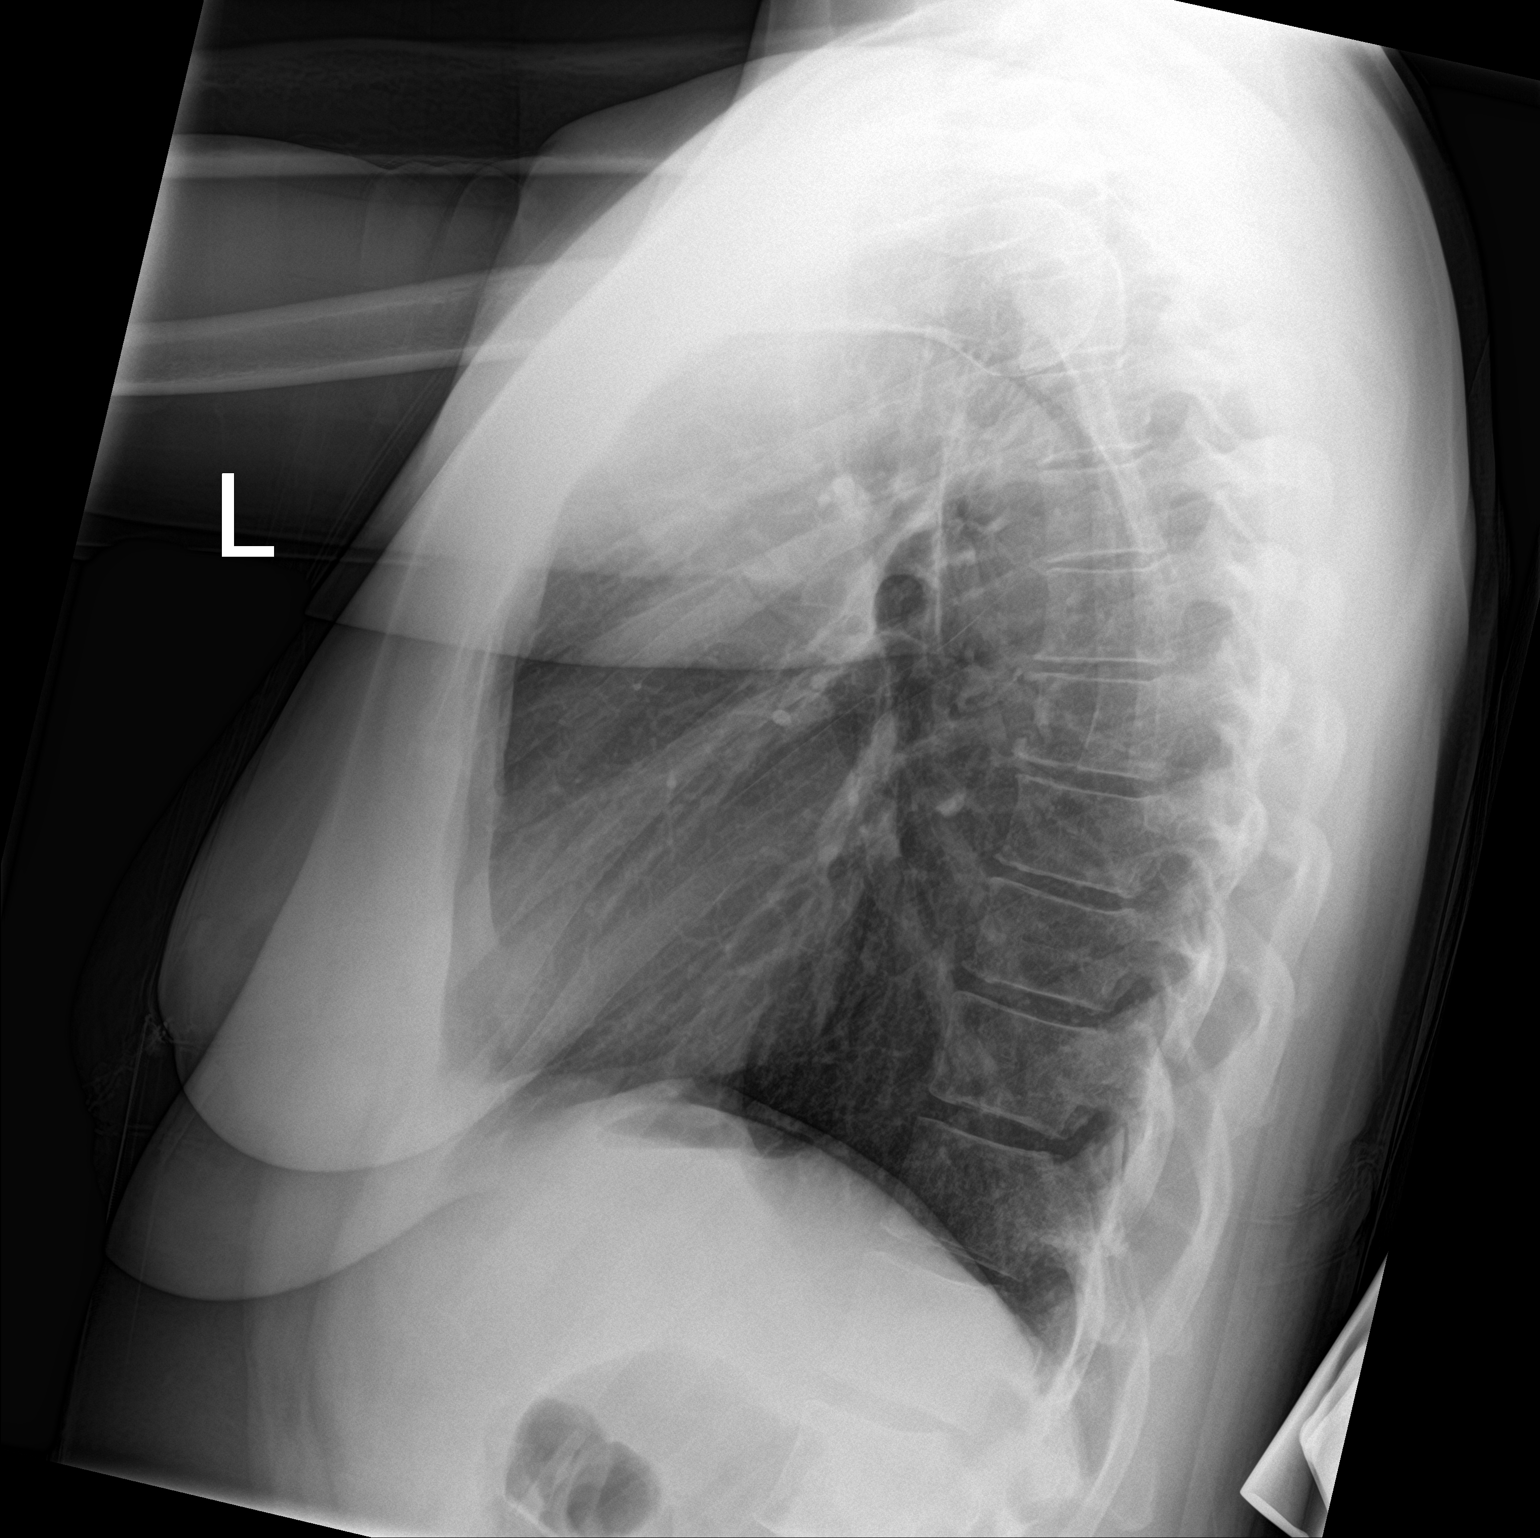

[chest ap]
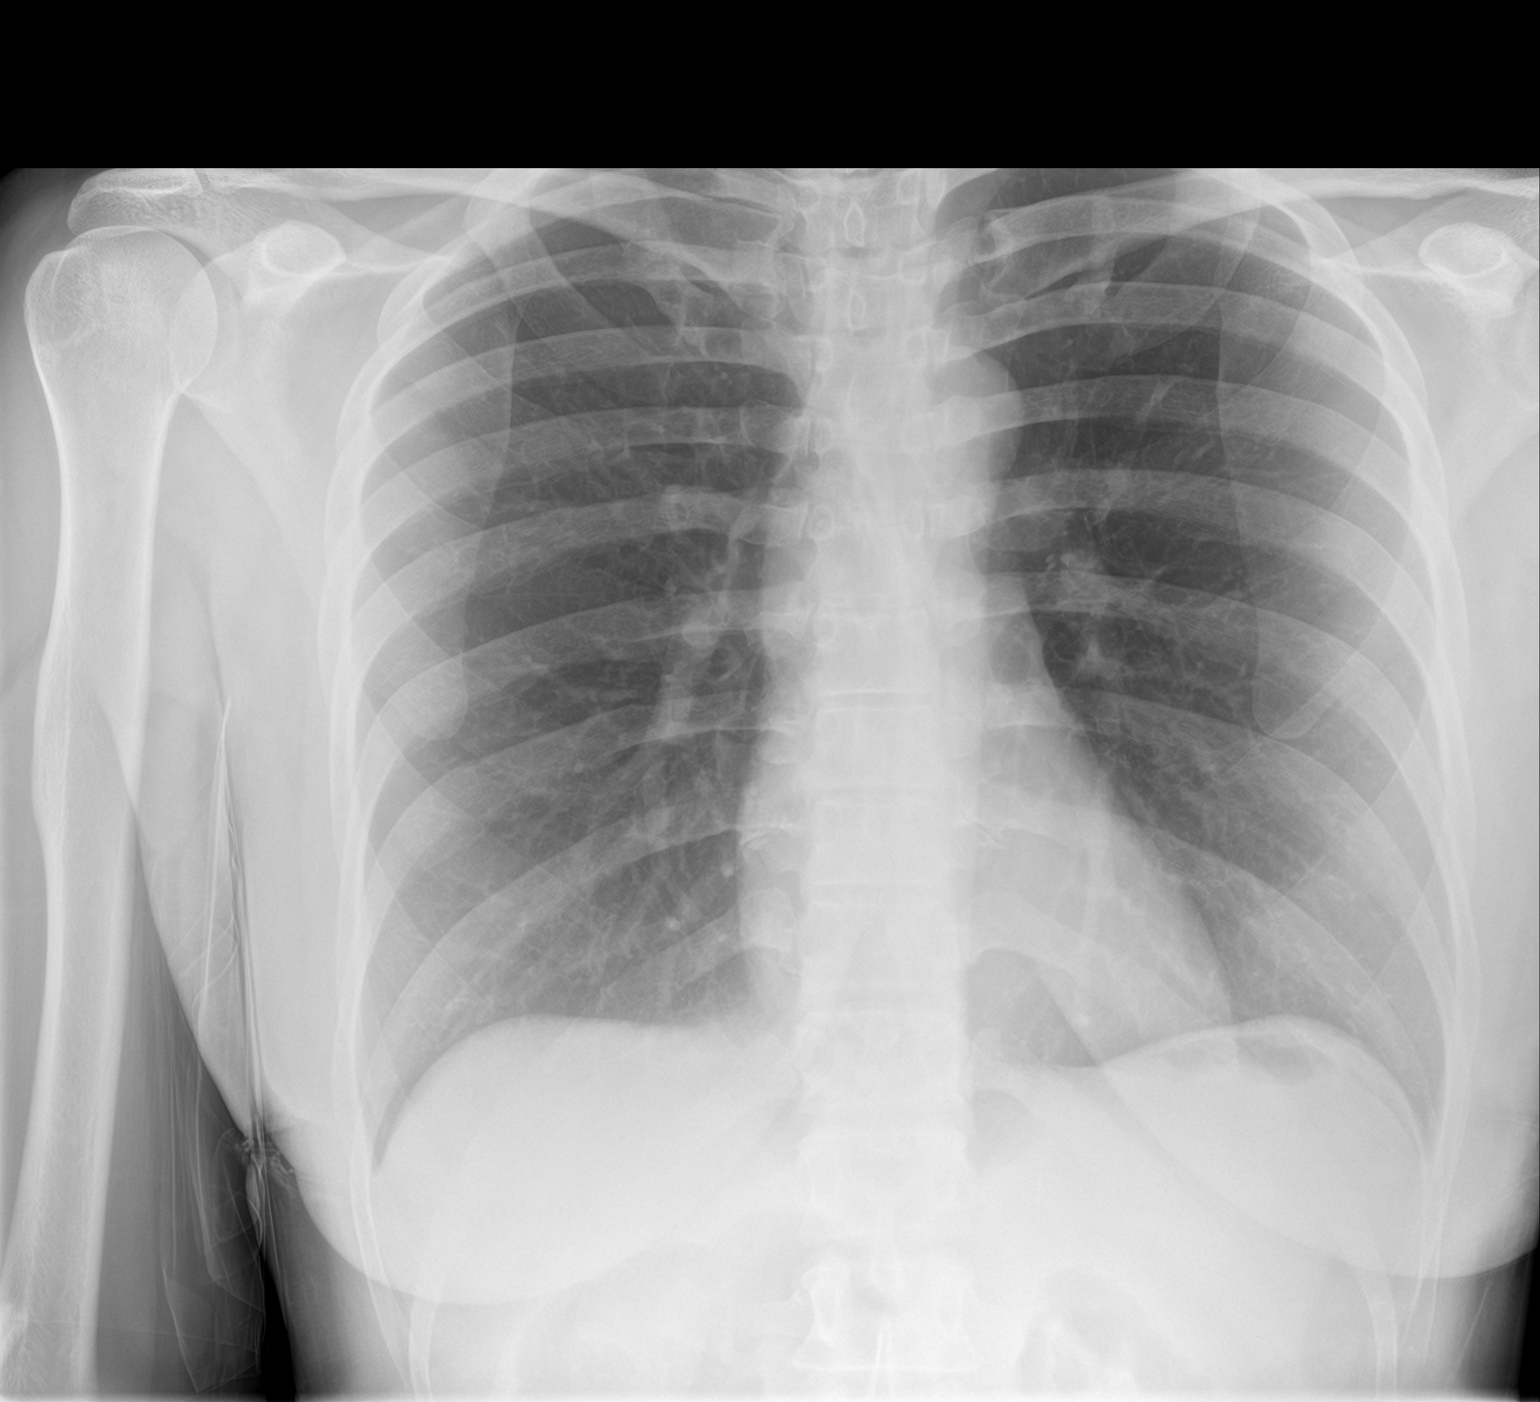

[2 of 2 positions shown; findings below may reference images not displayed]

FINDINGS: The heart size and mediastinal contours are within normal limits.
Both lungs are clear. The visualized skeletal structures are
unremarkable.
IMPRESSION: No acute abnormality of the lungs.

## 2020-12-07 ENCOUNTER — Ambulatory Visit: Payer: 59 | Admitting: Nurse Practitioner

## 2020-12-21 ENCOUNTER — Encounter: Payer: 59 | Admitting: Nurse Practitioner

## 2021-02-03 IMAGING — MG DIGITAL SCREENING BILAT W/ CAD
4 series · 4 of 4 positions shown · non-contrast
Comparison: None.

CLINICAL DATA: Screening.

EXAM:
DIGITAL SCREENING BILATERAL MAMMOGRAM WITH CAD

[L CC]
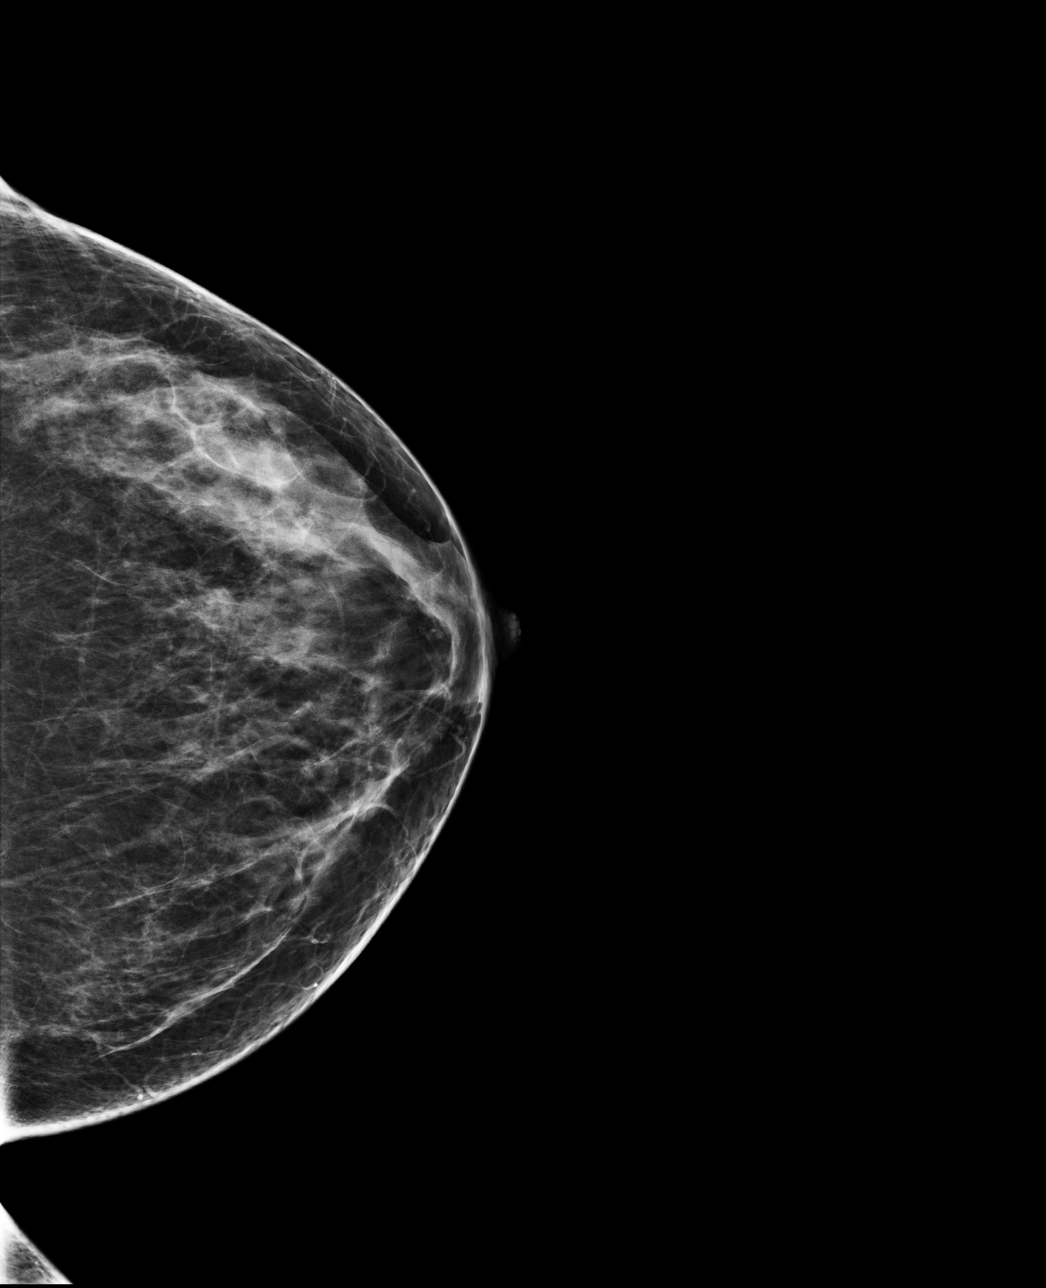

[R MLO]
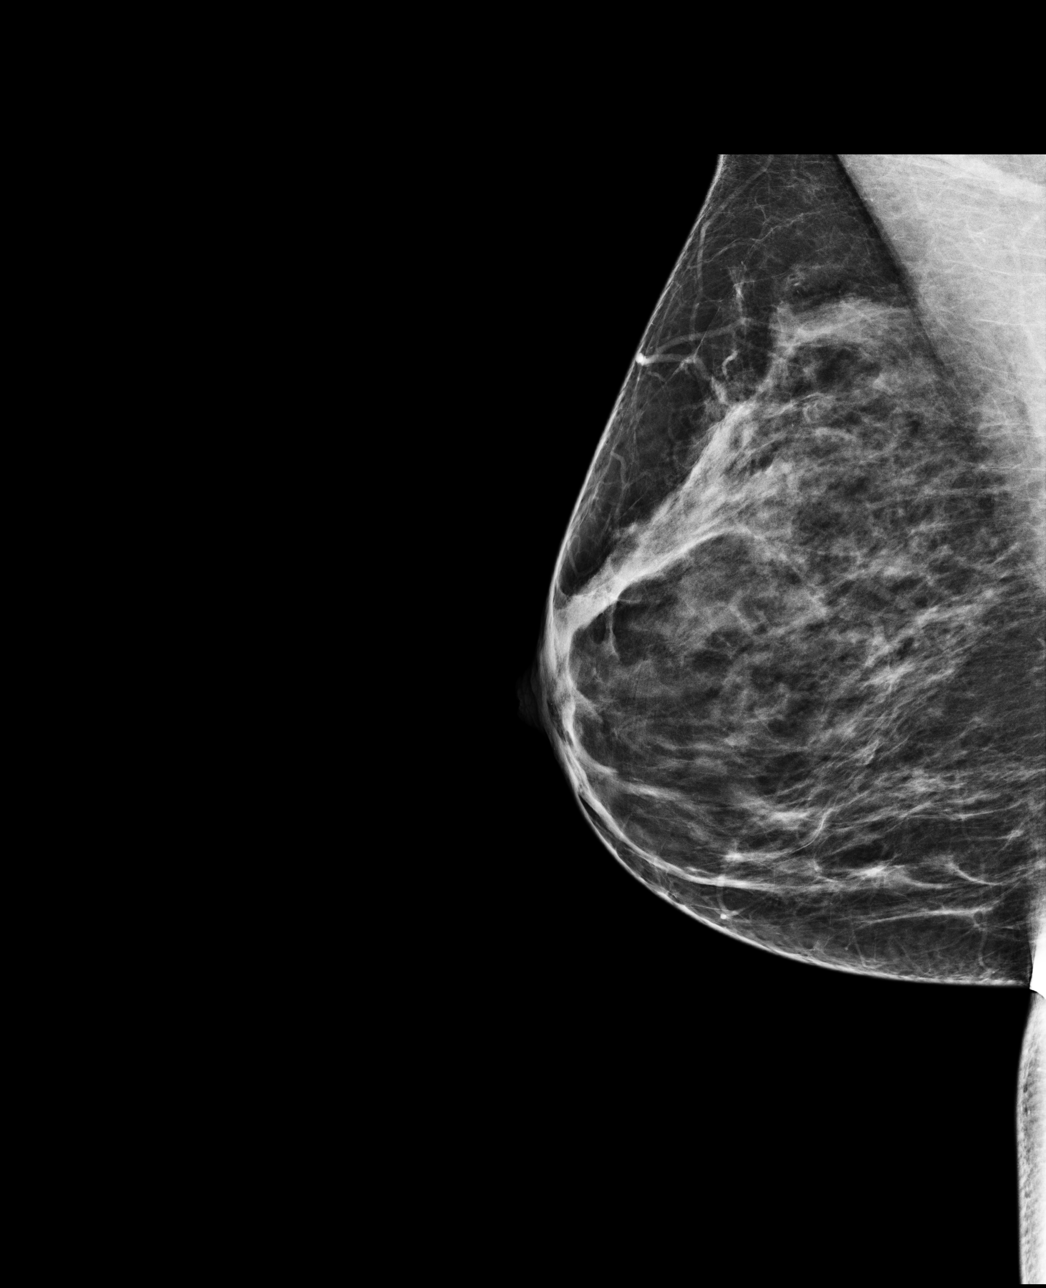

[R CC]
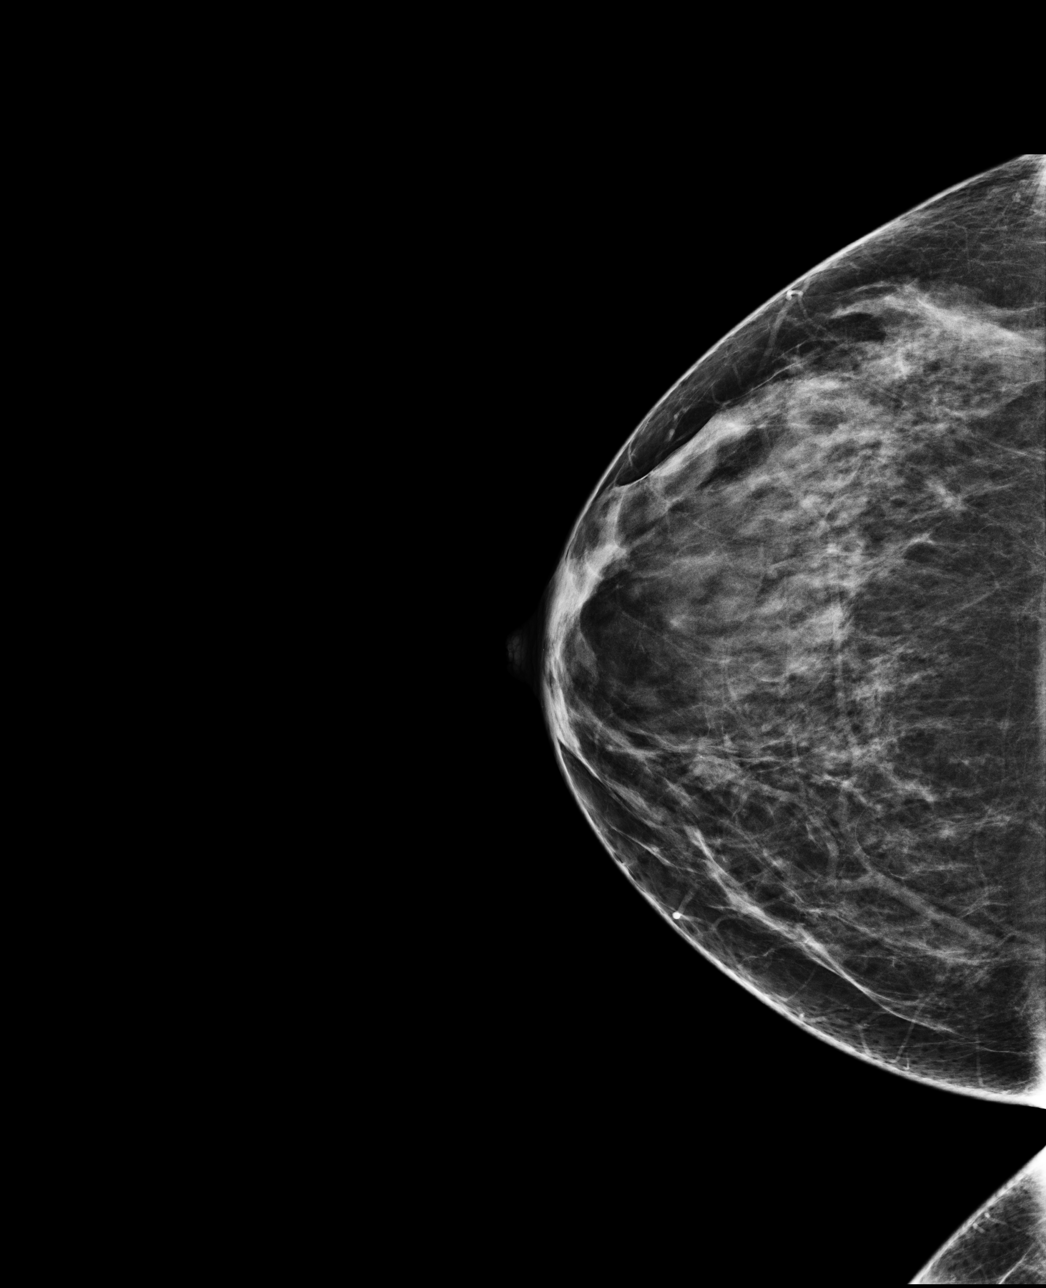

[L MLO]
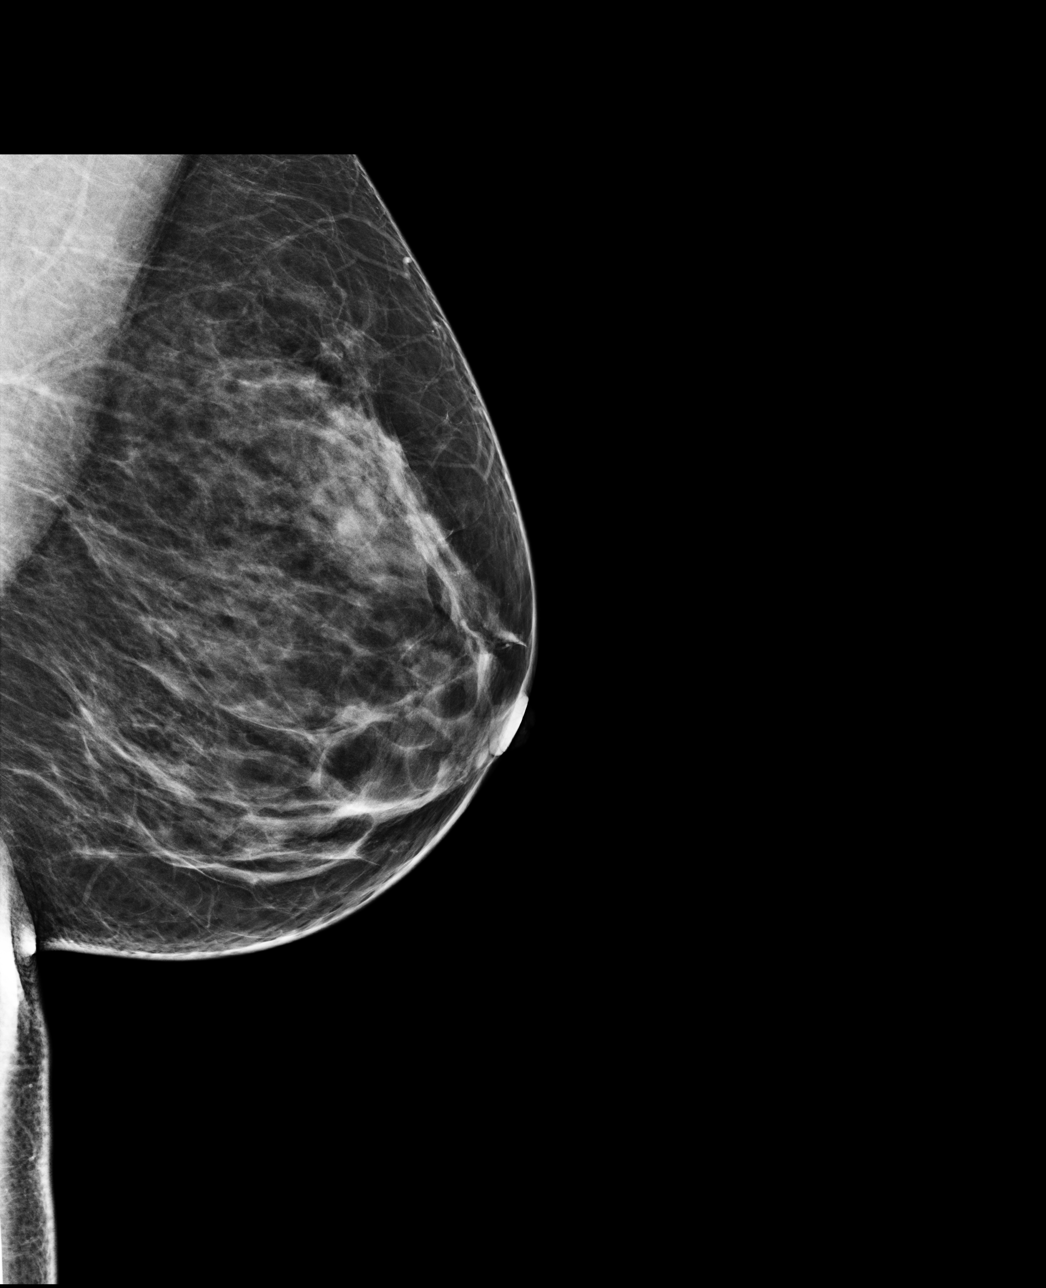

[4 of 4 positions shown; findings below may reference images not displayed]

ACR Breast Density Category b: There are scattered areas of
fibroglandular density.
FINDINGS: There are no findings suspicious for malignancy. Images were
processed with CAD.
IMPRESSION: No mammographic evidence of malignancy. A result letter of this
screening mammogram will be mailed directly to the patient.

RECOMMENDATION:
Screening mammogram in one year. (Code:[id number]-[id number])

BI-RADS CATEGORY  1: Negative.
